# Patient Record
Sex: Female | Born: 1970 | Race: White | Hispanic: No
Health system: Western US, Academic
[De-identification: ages and names within clinical notes are randomized; demographics above are authoritative.]

## PROBLEM LIST (undated history)

## (undated) DIAGNOSIS — J309 Allergic rhinitis, unspecified: Secondary | ICD-10-CM

## (undated) DIAGNOSIS — G43909 Migraine, unspecified, not intractable, without status migrainosus: Secondary | ICD-10-CM

## (undated) DIAGNOSIS — E785 Hyperlipidemia, unspecified: Secondary | ICD-10-CM

## (undated) DIAGNOSIS — G4700 Insomnia, unspecified: Secondary | ICD-10-CM

## (undated) DIAGNOSIS — K589 Irritable bowel syndrome without diarrhea: Secondary | ICD-10-CM

## (undated) DIAGNOSIS — F32A Depression, unspecified: Secondary | ICD-10-CM

## (undated) DIAGNOSIS — D352 Benign neoplasm of pituitary gland: Secondary | ICD-10-CM

## (undated) DIAGNOSIS — F419 Anxiety disorder, unspecified: Secondary | ICD-10-CM

## (undated) DIAGNOSIS — F319 Bipolar disorder, unspecified: Secondary | ICD-10-CM

## (undated) DIAGNOSIS — R519 Headache, unspecified: Secondary | ICD-10-CM

## (undated) HISTORY — PX: PR TONSILLECTOMY & ADENOIDECTOMY <AGE 12: 42820

## (undated) HISTORY — PX: ENDOSCOPY: SHX5148

## (undated) HISTORY — PX: PR ANES; KNEE AREA SURGERY: AN01360

## (undated) HISTORY — DX: Depression, unspecified: F32.A

## (undated) HISTORY — PX: PR TOTAL ABDOMINAL HYSTERECT W/WO RMVL TUBE OVARY: 58150

## (undated) HISTORY — DX: Migraine, unspecified, not intractable, without status migrainosus: G43.909

## (undated) HISTORY — DX: Allergic rhinitis, unspecified: J30.9

## (undated) HISTORY — PX: PR ANES; SHOULDER ARTHROSCOPY/SURGERY: AN29806

## (undated) HISTORY — PX: PR CHOLECYSTECTOMY: 47600

## (undated) HISTORY — DX: Benign neoplasm of pituitary gland: D35.2

## (undated) HISTORY — DX: Insomnia, unspecified: G47.00

## (undated) HISTORY — DX: Hyperlipidemia, unspecified: E78.5

## (undated) HISTORY — DX: Bipolar disorder, unspecified: F31.9

## (undated) HISTORY — DX: Anxiety disorder, unspecified: F41.9

## (undated) HISTORY — PX: PR UNLISTED PROCEDURE FOOT/TOES: 28899

## (undated) HISTORY — DX: Irritable bowel syndrome without diarrhea: K58.9

## (undated) HISTORY — DX: Headache, unspecified: R51.9

---

## 2014-11-12 ENCOUNTER — Encounter (HOSPITAL_BASED_OUTPATIENT_CLINIC_OR_DEPARTMENT_OTHER): Payer: Self-pay | Admitting: Neurological Surgery

## 2014-12-08 ENCOUNTER — Other Ambulatory Visit (HOSPITAL_BASED_OUTPATIENT_CLINIC_OR_DEPARTMENT_OTHER): Payer: Self-pay | Admitting: Family

## 2014-12-08 ENCOUNTER — Other Ambulatory Visit (HOSPITAL_BASED_OUTPATIENT_CLINIC_OR_DEPARTMENT_OTHER): Payer: Self-pay | Admitting: Neurological Surgery

## 2014-12-08 DIAGNOSIS — D352 Benign neoplasm of pituitary gland: Secondary | ICD-10-CM

## 2014-12-08 DIAGNOSIS — E220 Acromegaly and pituitary gigantism: Secondary | ICD-10-CM

## 2014-12-09 ENCOUNTER — Other Ambulatory Visit: Payer: Self-pay | Admitting: Family

## 2015-01-02 ENCOUNTER — Telehealth (HOSPITAL_BASED_OUTPATIENT_CLINIC_OR_DEPARTMENT_OTHER): Payer: Self-pay | Admitting: Neurological Surgery

## 2015-01-02 NOTE — Telephone Encounter (Signed)
(  TEXTING IS AN OPTION FOR [facility] ONLY)  Is this a [facility] clinic? No      RETURN CALL: Detailed message on voicemail only      SUBJECT:  General Message     REASON FOR REQUEST: Questions about upcoming appointment/illness    MESSAGE: Patient stated that she is starting to feel what might be a cold coming on-- sore throat, some drainage. She has multiple appointments next week and is wanting to speak to someone about how this may conflict with those appointments.

## 2015-01-02 NOTE — Telephone Encounter (Signed)
Patient scheduled for surgery on 01/09/15. Preops scheduled for 01/06/15-01/07/15. Patient will be traveling from [place].    Patient called in stating that she was having a sore throat and some nasal drainage. She followed up with a local MD. Her rapid strep test was negative and the official report should be available on 01/03/15. She states the nasal drainage is clear only. She denies any other symptoms. No fever present. She is using a nettipot, keeping hydrated, and resting.     Patient wanted to confirm that based on these symptoms that she was still safe to travel here next week for surgery. She will wear a mask when outside to prevent the risk of any new infections, especially on the plane ride. I told her that based on these symptoms she should still come to her appointments next week. She should follow up with her local doctor in getting the results from the strep culture. If they require her to be on antibiotics after the test comes back she is encouraged to take them.     I provided her with the nurse care line number to contact them over the long weekend. She understands she should call them if she develops any new or worsening symptoms. She is aware treatment must come from her local provider, however, they can page the resident on call to get their opinion if she should still come to her appointments with [place] next week.     Call back numbers provided. No further questions or concerns.

## 2015-01-04 ENCOUNTER — Ambulatory Visit: Payer: Self-pay

## 2015-01-04 NOTE — Telephone Encounter (Signed)
-----   Message from [place] sent at 01/04/2015  8:48 AM PST -----  Regarding: [facility] Pt - pre surgery question - pt have a cold    >> IRISH JOY E LAYADOR 01/04/2015 08:48 AM  [facility] Pt - pre surgery question - pt have a cold

## 2015-01-04 NOTE — Telephone Encounter (Signed)
12/27 tues itchy throat, r/o strep, neg  12/30-31 the symptoms worst, may have had a low grade fever 12/30  Using neti pot regularly  Chief complaint nasal congestion and sneezing  Pt denies cough or SOB  Surgery scheduled 1/06, leaving [facility] 1/03 am.  [facility] RN instructed:  Continue with extra fluids, good nutrition and rest  Keep on schedule, doctors will make determination on pts health status during pre-op assessment.    Call CCL back anytime if symptoms worsen or further questions.

## 2015-01-06 ENCOUNTER — Encounter (HOSPITAL_BASED_OUTPATIENT_CLINIC_OR_DEPARTMENT_OTHER): Payer: Medicare Other | Admitting: "Endocrinology

## 2015-01-06 ENCOUNTER — Ambulatory Visit (HOSPITAL_BASED_OUTPATIENT_CLINIC_OR_DEPARTMENT_OTHER): Payer: Medicare Other | Admitting: Neurological Surgery

## 2015-01-06 ENCOUNTER — Encounter (HOSPITAL_BASED_OUTPATIENT_CLINIC_OR_DEPARTMENT_OTHER): Payer: Self-pay | Admitting: "Endocrinology

## 2015-01-06 ENCOUNTER — Ambulatory Visit (HOSPITAL_BASED_OUTPATIENT_CLINIC_OR_DEPARTMENT_OTHER): Payer: Medicare Other

## 2015-01-06 ENCOUNTER — Other Ambulatory Visit (HOSPITAL_BASED_OUTPATIENT_CLINIC_OR_DEPARTMENT_OTHER): Payer: Self-pay

## 2015-01-06 ENCOUNTER — Other Ambulatory Visit (HOSPITAL_BASED_OUTPATIENT_CLINIC_OR_DEPARTMENT_OTHER): Payer: Medicare Other

## 2015-01-06 ENCOUNTER — Encounter (HOSPITAL_BASED_OUTPATIENT_CLINIC_OR_DEPARTMENT_OTHER): Payer: Self-pay | Admitting: Neurological Surgery

## 2015-01-06 ENCOUNTER — Other Ambulatory Visit: Payer: Self-pay | Admitting: Family

## 2015-01-06 ENCOUNTER — Ambulatory Visit (HOSPITAL_BASED_OUTPATIENT_CLINIC_OR_DEPARTMENT_OTHER): Payer: Medicare Other | Admitting: "Endocrinology

## 2015-01-06 VITALS — BP 112/61 | HR 96 | Temp 99.3°F | Ht 67.5 in | Wt 158.0 lb

## 2015-01-06 DIAGNOSIS — E220 Acromegaly and pituitary gigantism: Secondary | ICD-10-CM

## 2015-01-06 DIAGNOSIS — D352 Benign neoplasm of pituitary gland: Secondary | ICD-10-CM

## 2015-01-06 DIAGNOSIS — E785 Hyperlipidemia, unspecified: Secondary | ICD-10-CM | POA: Insufficient documentation

## 2015-01-06 LAB — CREATININE BY I_STAT (POC), ~~LOC~~: Creatinine (POC): 0.8 mg/dL (ref 0.38–1.02)

## 2015-01-06 NOTE — Progress Notes (Signed)
Carrie Mathis is a 45 year old female sent in consultation from Dr. 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  The patient describes obtaining an MRI of the head in April 2012 prompted by headaches.  This showed a incidental right sided sellar lesion.  At the time she remembers having pituitary evaluation which showed no evidence of hypersecretion.  She had a repeat MRI 15 months later that showed stability of lesion.  Because of symptoms of hair loss and worsening anxiety she had an endocrine evaluation which included a repeat MRI that revealed growth of this lesion and an evaluation that revealed increased IGF-I on 2 occasions and non-growth hormone suppression following a an oral glucose tolerance test.  Her other pituitary function was normal and prolactin is 11.6.  She has noted increased ring size and increase foot size.  She has had increased diaphoresis She has not noted a change in facial features.  She has lost weight voluntarily of about 25-30 pounds and this is resulted in a thinning of the face.  She denies any spacing of her dentition she denies snoring she denies osteoporotic fractures.  She's had no symptoms of carpal tunnel no symptoms of diabetes and no new onset of hypertension.  She has had profound lethargy and achiness.  An evaluation for hypercortisolism has been unrevealing with normal late night salivary cortisols 3.    Patient Active Problem List   Diagnosis   . Hyperlipidemia   . 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 Pituitary adenoma [facility])     Past Surgical History   Procedure Laterality Date   . Cesarean delivery only       1988, 1989   . Unlisted procedure foot/toes       spur excision   . Anes; shoulder arthroscopy/surgery     . Unlisted procedure foot/toes Left      Baxters never release   . Tonsillectomy & adenoidectomy <age 37     . Anes; knee area surgery     . Total abdominal hysterect w/wo rmvl tube ovary     . Cholecystectomy     . Endoscopy       Outpatient  Prescriptions Prior to Visit   Medication Sig Dispense Refill   . Biotin 1 MG Oral Cap Take by mouth daily.     . Estradiol (VIVELLE-DOT) 0.1 MG/24HR Transdermal PATCH BIWEEKLY Place 1 patch on the skin 2 times a week.     . Hydroxychloroquine Sulfate 200 MG Oral Tab Take 200 mg by mouth daily.     . Lidocaine 5 % External Patch Apply 1 patch onto the skin every 24 hours. Remove patch(s) after 12 hours.     Marland Kitchen LORazepam 0.5 MG Oral Tab Take 0.5 mg by mouth.     . Multiple Vitamins-Minerals (MULTIVITAMIN ADULT OR) Take by mouth daily.     . Niacin 500 MG Oral Tab Take 500 mg by mouth.     . Turmeric, Curcuma Longa, Powder Use 750 mg daily.     . Zolpidem Tartrate 10 MG Oral Tab Take 10 mg by mouth at bedtime as needed for sleep.       No facility-administered medications prior to visit.       SHx:No tobacco, no ETOH, lives in [place]   FHx mother with depression diabetes and hypertension and father is healthy brother with depression    ROS:  Review of Systems   Constitutional: Positive for malaise/fatigue.   Eyes: Negative.  Respiratory: Negative.    Cardiovascular: Negative.    Gastrointestinal: Negative.    Genitourinary: Positive for frequency.   Neurological: Positive for headaches.   Psychiatric/Behavioral: The patient is nervous/anxious and has insomnia.         Trouble concentrating  Decreased focus       Physical Exam  BP 112/61 mmHg  Pulse 96  Temp(Src) 99.3 F (37.4 C) (Temporal)  Ht 5' 7.5" (1.715 m)  Wt 158 lb (71.668 kg)  BMI 24.37 kg/m2  SpO2 95%  Abdomen  non-tender, non-distended, nl BS, no HSM, CV  Auscultation  regular rhythm, no murmurs, no rubs, no gallops, Eyes  EOMI,PERRLA, Fundi normal, General  in no apparent distress and well developed and well nourished, No overt acromegalic features HENT  NCAT, Oral mucosa moist, no lesions, Lungs  nl effort, clear to percussion and auscultation, Neck  Supple, no adenopathy, no abnormal masses, no asymmetry, no jugular venous distention, ROM normal,  carotid upstrokes normal, Neurologic  Motor strength normal, intact vibratory sense in lower exts, Musculoskeletal  Spine ROM normal. Muscular strength intact., Skin  no acne, no hirsutism, no acanthosis nigricans, no striae and Thyroid  normal to inspection and palpation    Laboratory and Radiologic Imaging:  10/19/2014 IGF-I of 2 63 ng/mL on 10/09/14 2 64 ng/mL (44-227)  Prolactin of 11.6  Growth hormone at baseline 1.9 following oral glucose tolerance test one hour 1.05, 2 hour  1.07  MRI- 10/23/14 with the right lateral sellar adenoma abutting the right cavernous sinus.  Increased growth compared to April 2012.  Impression: This is a 45 year old female with an evaluation consistent with a diagnosis  of acromegaly without overt clinical evidence of excess GH except hand and foot size increase.  Biochemical evidence of GH excess without other anterior pituitary dysfunction    Plan:   Scheduled for TSS with Dr Jonna Munro.  Excellent pre-op evaluation, will repeat IGF-1 to assess baseline at our lab for follow-up. Also will need post-op anterior pituitary assessment.

## 2015-01-06 NOTE — Progress Notes (Signed)
See dictation

## 2015-01-06 NOTE — Progress Notes (Signed)
Carrie Mathis, Carrie Mathis          [id number]          01/06/2015      NEUROSURGERY CLINIC NOTE       REFERRING PROVIDER     Dr. Wardell Heath, endocrinologist in [place], [place].    REASON FOR REFERRAL     Radiographic enlargement and symptomatic elevated hormones associated with known pituitary macroadenoma.    HISTORY OF PRESENT ILLNESS     Carrie Mathis is a 45 year old right-handed female who presents for initial consultation.  She was initially diagnosed with a pituitary macroadenoma in the setting of headaches back in 2012 locally in [place], [place].  She had a subsequent MRI 2 years later in 2014 that was radiographically stable, as well as IGF-1 levels measured to be 215 and 228 in 2012 and 2013 respectively (considered normal by an outside endocrinologist).  She apparently had been doing okay from a neurologic perspective, though started experiencing a constellation of symptoms over the past year.  Specifically, she began having progressive hair loss and thinning in April 2016.  This was attributed to a significant amount of stress secondary to the loss of her dog by her primary care physician.  She additionally developed symptoms of feeling sluggish, and this prompted a referral to her endocrinologist to be reinvestigate her hormone profile.  At this point, she ended up seeing a new endocrinologist, Dr. Wardell Heath.  She had an IGF-1 level in October that was measured at 264, with confirmatory repeat testing showing elevated levels.  Her growth hormone levels continued to be normal, though I only have the values from October 2016 to examine.  Reportedly, the rest of her pituitary hormone profile was normal, though given her elevated IGF-1 level which was new, and her new symptoms, an MRI was ordered which demonstrated radiographic growth of her known pituitary macroadenoma.  In hindsight, she does endorse that her ring size has changed, prompting her to no longer fit in her current ring.  Additionally, given her  sluggishness, she made a successful effort towards weight loss and actually lost 25 pounds, though paradoxically her shoe size actually increased.  She did experience cardiac palpitations at one point, and saw an outside cardiologist in [place] who monitored with a Holter monitor and did not detect any concerning pathology.  She was referred to the [facility] pituitary program for discussion of operative intervention.  Mild headaches, though these are her baseline.  No nausea or vomiting.  No seizures.  No numbness or weakness in one spot over the other.  She has no issues with her vision.  No issues with her body temperature control.    PAST MEDICAL HISTORY     1. Anxiety.  2. 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  3. Insomnia.    PAST SURGICAL HISTORY     1. Tonsillectomy.  2. Total abdominal hysterectomy.  3. Laparoscopic cholecystectomy.  4. Right plantar fascial release.  5. Right tarsal tunnel release in 2008.  6. Bilateral frozen shoulder syndrome.  7. Left knee arthroscopy.  8. Right knee meniscus tear and repair.  9. Right shoulder rotator cuff repair x2,  10. Left shoulder labral tear, repair x3.    CURRENT MEDICATIONS     1. Biotin.  2. Clobetasol.  3. Vitamin B12.  4. Estradiol.  5. Hydroxychloroquine.  6. Hydroxyzine.  7. Topical lidocaine for lower extremity pain  8. Lorazepam as needed.  9. Milk thistle.  10.  Pumpkin seed oil.  11. Niacin.  12. Propranolol as needed.  13. Rhodiola rosea.  14. Tumeric.  15. Zolpidem as needed.    ALLERGIES     No known drug allergies.    SOCIAL HISTORY     No smoking or recreational drug use.  Minimal alcohol.  She is married and lives in [place], [place] with her husband.  She used to work in M[employer] at [employer], though has not worked for 3 years, partially attributed to her constellation of medical issues.  Her and her husband have 2 adult children.    FAMILY HISTORY     Positive for an "aggressive brain tumor" that  sounds like a glioblastoma in her maternal aunt.  No other family history of brain tumors, brain bleeds or spine problems.    REVIEW OF SYSTEMS     A full review of systems was conducted and is negative for any symptoms other than those in history of present illness.    PHYSICAL EXAMINATION     VITAL SIGNS:  Blood pressure 112/61, temperature 37.4 Celsius, pulse 96, saturating 95% on room air.  GENERAL:  She is in no acute distress, pleasant, interactive with the exam.  CARDIOVASCULAR:  Regular rate and rhythm.  LUNGS:  Breathing comfortably on room air.  ABDOMEN:  Soft, nontender, nondistended.  NEUROLOGIC:  Awake, interactive, alert and oriented x3.  Pupils equally round and reactive to light.  Extraocular movements intact.  Face symmetric.  Tongue midline.  No pronator drift.  She has 5/5 strength throughout upper and lower extremities bilaterally.    IMAGING     MRI of the brain is reviewed with Dr. Jonna Munro and compared to her old prior imaging, she has a sellar mass, slightly asymmetric towards the right side, with invasion into the right cavernous sinus.  The mass is T2 isointense and T1 hypointense.  The mass measures approximately 1.3 x 1 cm in craniocaudal and medial lateral dimensions, respectively.  On reviewing her CT scan, it does not appear that she has any aeration of her anterior clinoid processes bilaterally.  The mass is not abutting the optic chiasm, though it is abutting the right carotid artery through the cavernous sinus.    ASSESSMENT AND PLAN     This is a right-handed female who presents with radiographic enlargement and new symptoms related to a pituitary macroadenoma, presumed to be due to elevated levels of growth hormone, IGF-1  In reviewing her history, we do believe that she is demonstrating a number of symptoms that are certainly new over the past year, though more concerningly, we wonder as to whether or not some of her connective tissue issues and multiple joint surgeries/tears  have been related to a subclinical elevation in growth hormone and IGF-1.  We discussed with her the operative procedure of a transsphenoidal approach, with the assistance of Dr. Kary Kos in the Department of Otolaryngology.  We also discussed the risk of cerebrospinal fluid leak, and the hopeful avoidance of an abdominal fat graft, nasal septal flap, and lumbar drain.  We explained all the risks and potential benefits, and specifically quoted her a 95% chance of cure with operative intervention.  All of her questions were answered, and she is eager to move forward with surgery, which is currently scheduled for Friday.

## 2015-01-07 ENCOUNTER — Encounter (HOSPITAL_BASED_OUTPATIENT_CLINIC_OR_DEPARTMENT_OTHER): Payer: Self-pay | Admitting: Family

## 2015-01-07 ENCOUNTER — Ambulatory Visit (HOSPITAL_BASED_OUTPATIENT_CLINIC_OR_DEPARTMENT_OTHER): Payer: Medicare Other | Admitting: Facial Plastic Surgery

## 2015-01-07 ENCOUNTER — Ambulatory Visit (HOSPITAL_BASED_OUTPATIENT_CLINIC_OR_DEPARTMENT_OTHER): Payer: Medicare Other | Admitting: Family

## 2015-01-07 ENCOUNTER — Other Ambulatory Visit (HOSPITAL_BASED_OUTPATIENT_CLINIC_OR_DEPARTMENT_OTHER): Payer: Self-pay | Admitting: Family

## 2015-01-07 ENCOUNTER — Ambulatory Visit (HOSPITAL_BASED_OUTPATIENT_CLINIC_OR_DEPARTMENT_OTHER): Payer: Medicare Other

## 2015-01-07 VITALS — BP 116/79 | HR 92 | Temp 98.6°F | Ht 67.5 in | Wt 160.0 lb

## 2015-01-07 DIAGNOSIS — E220 Acromegaly and pituitary gigantism: Secondary | ICD-10-CM

## 2015-01-07 DIAGNOSIS — D352 Benign neoplasm of pituitary gland: Secondary | ICD-10-CM

## 2015-01-07 DIAGNOSIS — Z01818 Encounter for other preprocedural examination: Secondary | ICD-10-CM

## 2015-01-07 LAB — URINALYSIS WORKUP
Bilirubin (Qual), URN: NEGATIVE
Glucose Qual, URN: NEGATIVE mg/dL
Ketones, URN: NEGATIVE mg/dL
Leukocyte Esterase, URN: NEGATIVE
Nitrite, URN: NEGATIVE
Occult Blood, URN: NEGATIVE
Protein (Alb Semiquant), URN: NEGATIVE mg/dL
Specific Gravity, URN: 1.005 g/mL (ref 1.002–1.027)
pH, URN: 6 (ref 5.0–8.0)

## 2015-01-07 LAB — CBC (HEMOGRAM)
Hematocrit: 43 % (ref 36–45)
Hemoglobin: 15 g/dL (ref 11.5–15.5)
MCH: 31.3 pg (ref 27.3–33.6)
MCHC: 35.2 g/dL (ref 32.2–36.5)
MCV: 89 fL (ref 81–98)
Platelet Count: 217 10*3/uL (ref 150–400)
RBC: 4.8 10*6/uL (ref 3.80–5.00)
RDW-CV: 12.3 % (ref 11.6–14.4)
WBC: 8.23 10*3/uL (ref 4.3–10.0)

## 2015-01-07 LAB — BASIC METABOLIC PANEL
Anion Gap: 8 (ref 4–12)
Calcium: 9.1 mg/dL (ref 8.9–10.2)
Carbon Dioxide, Total: 25 meq/L (ref 22–32)
Chloride: 103 meq/L (ref 98–108)
Creatinine: 0.65 mg/dL (ref 0.38–1.02)
GFR, Calc, African American: >60 mL/min/{1.73_m2}
GFR, Calc, European American: >60 mL/min/{1.73_m2}
Glucose: 103 mg/dL (ref 62–125)
Potassium: 3.8 meq/L (ref 3.6–5.2)
Sodium: 136 meq/L (ref 135–145)
Urea Nitrogen: 12 mg/dL (ref 8–21)

## 2015-01-07 LAB — ESTRADIOL: Estradiol: 134 pg/mL

## 2015-01-07 LAB — FOLLICLE STIMULATING HORMONE: Follicle Stimulating Hormone: 7 m[IU]/mL

## 2015-01-07 LAB — PROTHROMBIN TIME
Prothrombin INR: 1.1 (ref 0.8–1.3)
Prothrombin Time Patient: 13.6 s (ref 10.7–15.6)

## 2015-01-07 LAB — BLOOD TYPE CONFIRMATION: ABO/Rh: O POS

## 2015-01-07 LAB — PROLACTIN: Prolactin: 12 ng/mL

## 2015-01-07 NOTE — Patient Instructions (Signed)
Call the [facility] for any of the following:  . Severe or unusual headache  . Nausea and vomiting  . Temperature over 101  . Problems with balance  . Difficulty walking, poor coordination, stroke-like symptoms  . Impaired vision  . Loss of bowel and bladder control  . Personality changes, confusion, memory problems, seizures    Endoscopic Transnasal Transphenoidal Surgeries Only  . Apply Normal Saline nasal spray to each nostril every 1-2 hours as needed for congestion.  . Wipe nose gently for 2 weeks  . Do not blow your nose for 2 weeks unless Dr. Jonna Munro approves it.   Marland Kitchen Open mouth when coughing or sneezing  . Do not lift objects weighing over 5 pounds for 2 weeks unless otherwise instructed  . Do not strain when having a bowel movement.  Madaline Brilliant to shower 24 hours after surgery.Use your regular shampoo or baby shampoo.  . No soaking or bathe   Keep your head always above your heart level.

## 2015-01-07 NOTE — Progress Notes (Signed)
CC  H&P for upcoming surgery    SUBJECTIVE    History of Present Illness    Carrie Mathis is a very pleasant 45 year old female who has a past medical history of pituitary adenoma found in 04/2010 due to headaches. At the time her pituitary panel was within normal litis. However, serial imaging has showed increasing size of tumor along with elevated IGF1 levels and non GH suppression with OGTT. She has noticed increased ring size and shoe size along with increased night sweats. She denies any changes in her facial features, no changes in her dentition. Denies any heart disease, no dyslipidemia, or DM. She has intentionally lost over 25 lbs.     Amyla Delores Kisel denies any complaints of seizures, speech problems or weakness    Past Medical History  Past Medical History   Diagnosis Date   . Insomnia    . Anxiety    . Bipolar disorder ([place])    . Migraine    . Hyperlipidemia    . Depression    . IBS (irritable bowel syndrome)    . Pituitary adenoma ([facility])    . Allergic rhinitis due to allergen    . Headache        Past Surgical History  Past Surgical History   Procedure Laterality Date   . Cesarean delivery only       1988, 1989   . Unlisted procedure foot/toes       spur excision   . Anes; shoulder arthroscopy/surgery     . Unlisted procedure foot/toes Left      Baxters never release   . Tonsillectomy & adenoidectomy <age 52     . Anes; knee area surgery     . Total abdominal hysterect w/wo rmvl tube ovary     . Cholecystectomy     . Endoscopy         Medications  Current Outpatient Prescriptions   Medication Sig Dispense Refill   . Biotin 1 MG Oral Cap Take by mouth daily.     . Clobetasol Propionate 0.05 % External Solution 2 times a day.     . Cyanocobalamin (B-12) 2500 MCG Oral Tab      . Estradiol (VIVELLE-DOT) 0.1 MG/24HR Transdermal PATCH BIWEEKLY Place 1 patch on the skin 2 times a week.     . Hydroxychloroquine Sulfate 200 MG Oral Tab Take 200 mg by mouth daily.     . HydrOXYzine HCl 25 MG Oral Tab Take  25 mg by mouth at bedtime.     . Lidocaine 5 % External Patch Apply 1 patch onto the skin every 24 hours. Remove patch(s) after 12 hours.     Marland Kitchen LORazepam 0.5 MG Oral Tab Take 0.5 mg by mouth.     . Milk Thistle 200 MG Oral Cap      . Misc Natural Products (PUMPKIN SEED OIL OR)      . Multiple Vitamins-Minerals (MULTIVITAMIN ADULT OR) Take by mouth daily.     . Niacin 500 MG Oral Tab Take 500 mg by mouth.     . Propranolol HCl 20 MG Oral Tab Take 20 mg by mouth 2 times a day.     . Rhodiola rosea (RHODIOLA OR) Take 120 mg by mouth.     . Turmeric, Curcuma Longa, Powder Use 750 mg daily.     Marland Kitchen Unclassified (OTHER MEDS, SEE COMMENTS,) Essential oils:   Lavender, peppermint, lemon, lemon balm, citrus, cinnamon, thyme, frankincense     .  Zolpidem Tartrate 10 MG Oral Tab Take 10 mg by mouth at bedtime as needed for sleep.       No current facility-administered medications for this visit.       Allergies  Review of patient's allergies indicates:  No Known Allergies    Family History  Family History   Problem Relation Age of Onset   . Hypertension Mother    . Alcohol Abuse Father    . Depression Brother    . Drug Abuse Brother    . Crohn's Disease Brother    . Cancer Brother      Kidney (removed), Lung, Liver   . Cancer Maternal Aunt      Brain   . Heart Disease Maternal Aunt    . Mental Retardation Maternal Aunt    . Melanoma Maternal Aunt    . Cancer Paternal Aunt      Gastric    . Cancer Maternal Grandmother      Cervical   . Heart Disease Maternal Grandfather    . Cancer Paternal Grandmother      Breast       Social History  Social History     Social History   . Marital Status: Married     Spouse Name: N/A   . Number of Children: N/A   . Years of Education: N/A     Occupational History   . Not on file.     Social History Main Topics   . Smoking status: Never Smoker    . Smokeless tobacco: Never Used   . Alcohol Use: 0.6 oz/week     1 Glasses of wine per week   . Drug Use: No   . Sexual Activity: Not on file     Other  Topics Concern   . Not on file     Social History Narrative    She lives in [place], she is disabled, she used to be a Community education officer at [employer],. Married with two children. No smoking.        Review of Systems  Constitutional: Negative for fever, chills or night sweats. Intentional weight loss   Cardiovascular: Denies chest pain, heart palpitations, or edema.  Respiratory: Denies cough, wheezing, SOB, dyspnea.   GI/Hepatic: Negative for GERD, Positive for IBS alternating constipation and diarrhea.   Renal/urologic: Denies dysuria, hematuria.   Neurologic: See HPI.   Hematologic: Negative for coagulation disorders.  Endocrinology: Negative for thyroid disorders or diabetes      OBJECTIVE    Physical Exam  BP 116/79 mmHg  Pulse 92  Temp(Src) 98.6 F (37 C) (Temporal)  Ht 5' 7.5" (1.715 m)  Wt 160 lb (72.576 kg)  BMI 24.68 kg/m2  SpO2 100%  General Appearance: Pleasant, NAD, WH.   HEENT: Normocephalic. Carotid pulses are 2+ bilaterally without bruits. Neck has full active ROM.  Jaw mobility is intact.   Cardiac: S1, S2 with regular rate and rhythm. No coronary murmurs.   Respiratory: Regular even respiration, lungs are clear to auscultation bilaterally with good air exchange.    Abdominal: Abdomen is soft and non-tender to palpation. Active bowel sounds.  Extremities: Warm and non-cyanotic with 2+ peripheral pulses BUE/BLE.  No edema.   Neurologic: A&O to person, place, time and event. Pupils equal, round, and reactive to light. Extraocular movements intact. Face is symmetric, sensation is intact bilaterally.  Hearing intact to finger bilaterally. Speech articulated. Palate elevation symmetric. Uvula midline. Tongue protrusion midline. Shoulders shrug 5/5 bilaterally.  Strength 5/5 in  upper and lower extremities. Sensation was intact to  light touch in upper and lower extremities. No pronator drift.   Steady gait.   Psychiatric: Mood and affect are appropriate throughout interview. Pt cooperates well during  exam.      ASSESSMENT  45 year old female scheduled for a Extended ETSS for removal of tumor, possible fat graft, lumbar drain, septal mucosal flap  by Dr. Jonna Munro and Dr. Myna Bright on 01/09/2015      DISCUSSION AND PLAN   We had a discussion about the proposed surgical plan. We discussed the risks and benefits of the operation, the use of general anesthesia, and the follow up plan. Risks discussed include but are not limited to infection, bleeding, medication reaction, neurological deficits, CSF leak, incomplete resolution of pre-op symptoms, need for further surgery, poor wound healing, stroke, coma, even death.  The patient verbalized understanding and has elected to proceed with this elective procedure which will be scheduled to take place in the near future.  Reviewed medications and allergies.   Discussed goals and risks as noted above.   Surgical consent signed by patient and placed in chart.   Obtain laboratory studies: Basic Metabolic, CBC, Coags, UA, Type and Screen.  Prescribed Unasyn as preoperative antibiotics.   Pre-anesthesia visit scheduled for today   Patient verbalized understanding to discontinue all aspirin, NSAIDs, or any other salicylate containing products and supplements beginning 7 days prior to surgery, and not resume them until cleared by neurosurgery after the procedure.   Answered all patient's questions to the best of my abilities.       TIME STATEMENT   I spent greater than 50% of this 60 minute clinic encounter in face-to-face counseling regarding education, imaging review, discussing surgical plan, risks and benefits and coordination of care.   Thank you very much for allowing me to participate in Mr. Poppert care , please contact my office with any questions or concerns.     Salome Holmes, ARNP

## 2015-01-07 NOTE — Progress Notes (Signed)
OTOLARYNGOLOGY - Facial Plastic Surgery CLINIC NEW PATIENT CONSULTATION       Chief Complaint   Patient presents with   . 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  Initially dx'd in 2012 presenting with HA. Since then IGF-1's have been stable in low 200's. MRIs also stable until recently. During last year she has developed hair loss, low energy, and found to have IGF-1 of 264 last October. MRI has demonstrated growth of the math. She has had increase in finger size and shoe size despite overall intentional weight loss.   She denies disturbance of olfaction, taste, vision, or breathing. No h/o recurrent airway / sinus infections.      Current outpatient prescriptions:   .  Biotin 1 MG Oral Cap, Take by mouth daily., Disp: , Rfl:   .  Clobetasol Propionate 0.05 % External Solution, 2 times a day., Disp: , Rfl:   .  Cyanocobalamin (B-12) 2500 MCG Oral Tab, , Disp: , Rfl:   .  Estradiol (VIVELLE-DOT) 0.1 MG/24HR Transdermal PATCH BIWEEKLY, Place 1 patch on the skin 2 times a week., Disp: , Rfl:   .  Hydroxychloroquine Sulfate 200 MG Oral Tab, Take 200 mg by mouth daily., Disp: , Rfl:   .  HydrOXYzine HCl 25 MG Oral Tab, Take 25 mg by mouth at bedtime., Disp: , Rfl:   .  Lidocaine 5 % External Patch, Apply 1 patch onto the skin every 24 hours. Remove patch(s) after 12 hours., Disp: , Rfl:   .  LORazepam 0.5 MG Oral Tab, Take 0.5 mg by mouth., Disp: , Rfl:   .  Milk Thistle 200 MG Oral Cap, , Disp: , Rfl:   .  Misc Natural Products (PUMPKIN SEED OIL OR), , Disp: , Rfl:   .  Multiple Vitamins-Minerals (MULTIVITAMIN ADULT OR), Take by mouth daily., Disp: , Rfl:   .  Niacin 500 MG Oral Tab, Take 500 mg by mouth., Disp: , Rfl:   .  Propranolol HCl 20 MG Oral Tab, Take 20 mg by mouth 2 times a day., Disp: , Rfl:   .  Rhodiola rosea (RHODIOLA OR), Take 120 mg by mouth., Disp: , Rfl:   .  Turmeric, Curcuma Longa, Powder, Use 750 mg daily., Disp: ,  Rfl:   .  Unclassified (OTHER MEDS, SEE COMMENTS,), Essential oils:  Lavender, peppermint, lemon, lemon balm, citrus, cinnamon, thyme, frankincense, Disp: , Rfl:   .  Zolpidem Tartrate 10 MG Oral Tab, Take 10 mg by mouth at bedtime as needed for sleep., Disp: , Rfl:   Please see medication list provided by the patient and confirmed.    ALLERGIES     Review of patient's allergies indicates:  No Known Allergies    Past Medical History   Diagnosis Date   . Insomnia    . Anxiety    . Bipolar disorder ([place])    . Migraine    . Hyperlipidemia    . Depression    . IBS (irritable bowel syndrome)    . Pituitary adenoma ([place])    . Allergic rhinitis due to allergen    . Headache      Past Surgical History   Procedure Laterality Date   . Cesarean delivery only       1988, 1989   . Unlisted procedure foot/toes       spur excision   .  Anes; shoulder arthroscopy/surgery     . Unlisted procedure foot/toes Left      Baxters never release   . Tonsillectomy & adenoidectomy <age 43     . Anes; knee area surgery     . Total abdominal hysterect w/wo rmvl tube ovary     . Cholecystectomy     . Endoscopy       Please see the Patient Health History - Otolaryngology Form page 1-4 completed today and confirmed with the patient.    It has been scanned into Epic Care and is available for review under Media tab in Chart Review.    FAMILY HISTORY    Family History   Problem Relation Age of Onset   . Hypertension Mother    . Alcohol Abuse Father    . Depression Brother    . Drug Abuse Brother    . Crohn's Disease Brother    . Cancer Brother      Kidney (removed), Lung, Liver   . Cancer Maternal Aunt      Brain   . Heart Disease Maternal Aunt    . Mental Retardation Maternal Aunt    . Melanoma Maternal Aunt    . Cancer Paternal Aunt      Gastric    . Cancer Maternal Grandmother      Cervical   . Heart Disease Maternal Grandfather    . Cancer Paternal Grandmother      Breast      Please see the Patient Health History - Otolaryngology Form page 1-4  completed today and confirmed with the patient.  It has been scanned into Epic Care and is available for review under Media tab in Chart Review.    SOCIAL HISTORY     Social History     Social History   . Marital Status: Married     Spouse Name: N/A   . Number of Children: N/A   . Years of Education: N/A     Occupational History   . Not on file.     Social History Main Topics   . Smoking status: Never Smoker    . Smokeless tobacco: Never Used   . Alcohol Use: 0.6 oz/week     1 Glasses of wine per week   . Drug Use: No   . Sexual Activity: Not on file     Other Topics Concern   . Not on file     Social History Narrative    She lives in [place], she is disabled, she used to be a Community education officer at [employer],. Married with two children. No smoking.      Please see the Patient Health History - Otolaryngology Form page 1-4 completed today and confirmed with the patient.    REVIEW OF SYSTEMS     Review of Systems -   Please see the Patient Health History - Otolaryngology Form page 3 completed today and confirmed with the patient.    PHYSICAL EXAMINATION    General: healthy, alert, no distress  Skin: Skin color, texture, turgor normal. No rashes or concerning lesions  Psychiatric:  Normal mood and affect  Neuro:  Alert and oriented x 3. Cranial nerves 2-12 intact  Head: Normocephalic. No masses, lesions, tenderness or abnormalities  Eyes: Lids/periorbital skin normal, Conjunctivae/corneas clear, PERRL, EOM's intact, Globes in normal position  Ears: External ears normal. Canals normal.  Nose: Septum relatively midline, no polyps or purulence  Oropharynx: Lips, mucosa, and tongue normal. Teeth and gums normal., posterior pharynx  without erythema or drainage  Neck: No adenopathy. 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  We discussed the surgery, it's benefits and risks in depth, and she'd like to proceed.   Will arrange partial postop f/u in [place].       Sincerely,  Vania Rea. Crist Infante, M.D.  Professor and Risk analyst, Division of Facial Plastic Surgery  Department of Otolaryngology - [place] [facility]  Pager: [phone]

## 2015-01-08 NOTE — Progress Notes (Signed)
Carrie Mathis is referred to my neurosurgical office and skull base clinic as part of the [place] multi-disciplinary pituitary program clinic at the [facility]. I spent greater than 45 minutes, more than 50% of which was in face to face counseling, review of imaging and discussion of management strategy options. We spent a period of time discussing that I do believe she has early acromegaly and the natural history of the disease. She has a growing tumor that is now well into the right cavernous sinus. I recommend surgery for removal and attempt at cure biochemically. She also met with Dr. Hinton Lovely of neuro-endocrine and tomorrow Dr. Kary Kos of ENT.    We reviewed the natural history of pituitary tumors and the World Health Organization [facility]) grading scheme. We discussed the options of watchful waiting with serial imaging, up front radiation of any kind (photons, protons, radiosurgery such as gammaknife or cyberknife), medical management and surgical resection. The risks and benefits of the extended endoscopic TSS (possibel fat graft, septal mucosal flap and/or lumbar drain) including but not limited to the following: CSF leak, transient or permanent loss of smell, sinus disease, headaches, hypopituitarism, diabetes insipidus (DI), loss of vision, ptosis, diplopia, facial numbness, arterial or venous injury leading to hematoma, stroke, MI, DVT/PE, coma or even death.     She has my card and will let us know how she would like to proceed. If she would like to pursue surgery, this will take place with myself and Dr. Kary Kos.

## 2015-01-09 ENCOUNTER — Inpatient Hospital Stay
Admission: RE | Admit: 2015-01-09 | Discharge: 2015-01-11 | DRG: 615 | Disposition: A | Discharge status: Home / Self Care | Payer: Medicare Other | Attending: Neurological Surgery | Admitting: Neurological Surgery

## 2015-01-09 ENCOUNTER — Inpatient Hospital Stay (HOSPITAL_COMMUNITY): Payer: Medicare Other | Admitting: Neurological Surgery

## 2015-01-09 ENCOUNTER — Other Ambulatory Visit (HOSPITAL_BASED_OUTPATIENT_CLINIC_OR_DEPARTMENT_OTHER): Payer: Self-pay | Admitting: Neurological Surgery

## 2015-01-09 ENCOUNTER — Encounter (HOSPITAL_BASED_OUTPATIENT_CLINIC_OR_DEPARTMENT_OTHER): Payer: Self-pay | Admitting: "Endocrinology

## 2015-01-09 ENCOUNTER — Other Ambulatory Visit: Payer: Self-pay | Admitting: Unknown Physician Specialty

## 2015-01-09 DIAGNOSIS — Z833 Family history of diabetes mellitus: Secondary | ICD-10-CM

## 2015-01-09 DIAGNOSIS — Z8249 Family history of ischemic heart disease and other diseases of the circulatory system: Secondary | ICD-10-CM

## 2015-01-09 DIAGNOSIS — E220 Acromegaly and pituitary gigantism: Secondary | ICD-10-CM | POA: Diagnosis present

## 2015-01-09 DIAGNOSIS — G5600 Carpal tunnel syndrome, unspecified upper limb: Secondary | ICD-10-CM | POA: Diagnosis present

## 2015-01-09 DIAGNOSIS — Z818 Family history of other mental and behavioral disorders: Secondary | ICD-10-CM

## 2015-01-09 DIAGNOSIS — D352 Benign neoplasm of pituitary gland: Principal | ICD-10-CM | POA: Diagnosis present

## 2015-01-09 DIAGNOSIS — G5750 Tarsal tunnel syndrome, unspecified lower limb: Secondary | ICD-10-CM | POA: Diagnosis present

## 2015-01-09 DIAGNOSIS — E785 Hyperlipidemia, unspecified: Secondary | ICD-10-CM | POA: Diagnosis present

## 2015-01-09 LAB — OR BLOOD GAS PANEL, VEN
Base Deficit Blood, VEN: 2.2 meq/L — ABNORMAL HIGH (ref 0.0–2.0)
Base Deficit Blood, VEN: 2.4 meq/L — ABNORMAL HIGH (ref 0.0–2.0)
Bicarbonate, VEN: 23 meq/L (ref 23–27)
Bicarbonate, VEN: 23 meq/L (ref 23–27)
Calcium (Ionized): 1.1 mmol/L — ABNORMAL LOW (ref 1.18–1.38)
Calcium (Ionized): 1.14 mmol/L — ABNORMAL LOW (ref 1.18–1.38)
Carboxyhemoglobin, VEN: 0.9 %
Carboxyhemoglobin, VEN: 1 %
Glucose: 82 mg/dL (ref 62–125)
Glucose: 84 mg/dL (ref 62–125)
Hematocrit: 30 % — ABNORMAL LOW (ref 36–45)
Hematocrit: 35 % — ABNORMAL LOW (ref 36–45)
L Lactate (Direct), Venous Whole Blood: 0.6 mmol/L (ref 0.6–1.9)
L Lactate (Direct), Venous Whole Blood: 0.7 mmol/L (ref 0.6–1.9)
Methemoglobin, VEN: 0.6 % (ref <3.0)
Methemoglobin, VEN: 0.7 % (ref <3.0)
O2 Content, VEN: 13 VOL%
O2 Content, VEN: 16.1 VOL%
O2 Saturation (Frac.), VEN: 94 %
O2 Saturation (Frac.), VEN: 98 %
Potassium: 3.5 meq/L — ABNORMAL LOW (ref 3.7–5.2)
Potassium: 3.6 meq/L — ABNORMAL LOW (ref 3.7–5.2)
Sodium: 140 meq/L (ref 136–145)
Sodium: 140 meq/L (ref 136–145)
Total Hemoglobin, VEN: 11.4 g/dL — ABNORMAL LOW (ref 11.5–15.5)
Total Hemoglobin, VEN: 9.8 g/dL — ABNORMAL LOW (ref 11.5–15.5)
pCO2, VEN: 37 mmHg — ABNORMAL LOW (ref 42–50)
pCO2, VEN: 41 mmHg — ABNORMAL LOW (ref 42–50)
pH, VEN: 7.36 (ref 7.32–7.40)
pH, VEN: 7.39 (ref 7.32–7.40)
pO2, VEN: 168 mmHg — ABNORMAL HIGH (ref 35–40)
pO2, VEN: 76 mmHg — ABNORMAL HIGH (ref 35–40)

## 2015-01-09 LAB — OR BLOOD GAS PANEL, ART
Base Deficit Blood, ART: 1.3 meq/L (ref 0.0–2.0)
Bicarbonate: 23 meq/L (ref 22–26)
Calcium (Ionized): 1.17 mmol/L — ABNORMAL LOW (ref 1.18–1.38)
Carboxyhemoglobin, ART: 0.8 %
Glucose: 87 mg/dL (ref 62–125)
Hematocrit: 39 % (ref 36–45)
L Lactate (Direct), ART WB: 0.5 mmol/L (ref 0.4–1.0)
Methemoglobin, ART: 0.5 % (ref <3.0)
O2 Content: 18.1 VOL% (ref 15–23)
O2 Sat (Frac.), ART: 99 % (ref 94–99)
Potassium: 3.5 meq/L — ABNORMAL LOW (ref 3.7–5.2)
Sodium: 140 meq/L (ref 136–145)
Total Hemoglobin, ART: 12.6 g/dL (ref 11.5–15.5)
pCO2, ART: 36 mmHg (ref 33–48)
pH, ART: 7.41 (ref 7.35–7.45)
pO2: 245 mmHg — ABNORMAL HIGH (ref 70–95)

## 2015-01-09 LAB — SPECIFIC GRAVITY, URN: Specific Gravity, URN: 1.017 g/mL (ref 1.002–1.027)

## 2015-01-09 LAB — URINE C/S: Colony Count: 2500

## 2015-01-09 LAB — GLUCOSE POC, ~~LOC~~
Glucose (POC): 85 mg/dL (ref 62–125)
Glucose (POC): 111 mg/dL (ref 62–125)

## 2015-01-09 LAB — TYPE AND SCREEN, EXTENDED
ABO/Rh: O POS
Antibody Screen: NEGATIVE
Units Ordered: 2

## 2015-01-09 LAB — SODIUM, WBLD: Sodium: 141 meq/L (ref 136–145)

## 2015-01-09 LAB — OSMOLALITY, URINE: Osmolality, URN: 550 mosm/kg (ref 100–850)

## 2015-01-10 LAB — CBC (HEMOGRAM)
Hematocrit: 28 % — ABNORMAL LOW (ref 36–45)
Hematocrit: 25 % — ABNORMAL LOW (ref 36–45)
Hematocrit: 25 % — ABNORMAL LOW (ref 36–45)
Hemoglobin: 9.5 g/dL — ABNORMAL LOW (ref 11.5–15.5)
Hemoglobin: 8.8 g/dL — ABNORMAL LOW (ref 11.5–15.5)
Hemoglobin: 8.7 g/dL — ABNORMAL LOW (ref 11.5–15.5)
MCH: 30.8 pg (ref 27.3–33.6)
MCH: 31.4 pg (ref 27.3–33.6)
MCH: 31.8 pg (ref 27.3–33.6)
MCHC: 34.1 g/dL (ref 32.2–36.5)
MCHC: 35.1 g/dL (ref 32.2–36.5)
MCHC: 35.4 g/dL (ref 32.2–36.5)
MCV: 91 fL (ref 81–98)
MCV: 90 fL (ref 81–98)
MCV: 90 fL (ref 81–98)
Platelet Count: 173 10*3/uL (ref 150–400)
Platelet Count: 147 10*3/uL — ABNORMAL LOW (ref 150–400)
Platelet Count: 153 10*3/uL (ref 150–400)
RBC: 3.08 10*6/uL — ABNORMAL LOW (ref 3.80–5.00)
RBC: 2.8 10*6/uL — ABNORMAL LOW (ref 3.80–5.00)
RBC: 2.74 10*6/uL — ABNORMAL LOW (ref 3.80–5.00)
RDW-CV: 12.4 % (ref 11.6–14.4)
RDW-CV: 12.3 % (ref 11.6–14.4)
RDW-CV: 12.4 % (ref 11.6–14.4)
WBC: 13.35 10*3/uL — ABNORMAL HIGH (ref 4.3–10.0)
WBC: 13.27 10*3/uL — ABNORMAL HIGH (ref 4.3–10.0)
WBC: 11.87 10*3/uL — ABNORMAL HIGH (ref 4.3–10.0)

## 2015-01-10 LAB — OSMOLALITY, URINE
Osmolality, URN: 697 mosm/kg (ref 100–850)
Osmolality, URN: 717 mosm/kg (ref 100–850)
Osmolality, URN: 749 mosm/kg (ref 100–850)
Osmolality, URN: 752 mosm/kg (ref 100–850)

## 2015-01-10 LAB — SPECIFIC GRAVITY, URN
Specific Gravity, URN: 1.024 g/mL (ref 1.002–1.027)
Specific Gravity, URN: 1.023 g/mL (ref 1.002–1.027)
Specific Gravity, URN: 1.024 g/mL (ref 1.002–1.027)
Specific Gravity, URN: 1.022 g/mL (ref 1.002–1.027)

## 2015-01-10 LAB — CORTISOL
Cortisol: 32.8 ug/dL
Cortisol: 25.6 ug/dL

## 2015-01-10 LAB — BASIC METABOLIC PANEL
Anion Gap: 7 (ref 4–12)
Calcium: 8 mg/dL — ABNORMAL LOW (ref 8.9–10.2)
Carbon Dioxide, Total: 22 meq/L (ref 22–32)
Chloride: 108 meq/L (ref 98–108)
Creatinine: 0.57 mg/dL (ref 0.38–1.02)
GFR, Calc, African American: >60 mL/min/{1.73_m2}
GFR, Calc, European American: >60 mL/min/{1.73_m2}
Glucose: 139 mg/dL — ABNORMAL HIGH (ref 62–125)
Potassium: 3.5 meq/L — ABNORMAL LOW (ref 3.6–5.2)
Sodium: 137 meq/L (ref 135–145)
Urea Nitrogen: 8 mg/dL (ref 8–21)

## 2015-01-10 LAB — ELECTROLYTES
Anion Gap: 9 (ref 4–12)
Carbon Dioxide, Total: 22 meq/L (ref 22–32)
Chloride: 110 meq/L — ABNORMAL HIGH (ref 98–108)
Potassium: 3.6 meq/L (ref 3.6–5.2)
Sodium: 141 meq/L (ref 135–145)

## 2015-01-10 LAB — OSMOLALITY
Osmolality: 288 mosm/kg (ref 280–300)
Osmolality: 286 mosm/kg (ref 280–300)
Osmolality: 281 mosm/kg (ref 280–300)

## 2015-01-10 LAB — SODIUM
Sodium: 140 meq/L (ref 135–145)
Sodium: 137 meq/L (ref 135–145)
Sodium: 138 meq/L (ref 135–145)

## 2015-01-10 LAB — PROTHROMBIN TIME
Prothrombin INR: 1.3 (ref 0.8–1.3)
Prothrombin Time Patient: 15.6 s (ref 10.7–15.6)

## 2015-01-11 LAB — BASIC METABOLIC PANEL
Anion Gap: 6 (ref 4–12)
Calcium: 8.2 mg/dL — ABNORMAL LOW (ref 8.9–10.2)
Carbon Dioxide, Total: 25 meq/L (ref 22–32)
Chloride: 113 meq/L — ABNORMAL HIGH (ref 98–108)
Creatinine: 0.56 mg/dL (ref 0.38–1.02)
GFR, Calc, African American: >60 mL/min/{1.73_m2}
GFR, Calc, European American: >60 mL/min/{1.73_m2}
Glucose: 114 mg/dL (ref 62–125)
Potassium: 3.5 meq/L — ABNORMAL LOW (ref 3.6–5.2)
Sodium: 144 meq/L (ref 135–145)
Urea Nitrogen: 3 mg/dL — ABNORMAL LOW (ref 8–21)

## 2015-01-11 LAB — CBC (HEMOGRAM)
Hematocrit: 24 % — ABNORMAL LOW (ref 36–45)
Hemoglobin: 8.3 g/dL — ABNORMAL LOW (ref 11.5–15.5)
MCH: 31.4 pg (ref 27.3–33.6)
MCHC: 35.2 g/dL (ref 32.2–36.5)
MCV: 89 fL (ref 81–98)
Platelet Count: 145 10*3/uL — ABNORMAL LOW (ref 150–400)
RBC: 2.64 10*6/uL — ABNORMAL LOW (ref 3.80–5.00)
RDW-CV: 12.5 % (ref 11.6–14.4)
WBC: 7.25 10*3/uL (ref 4.3–10.0)

## 2015-01-11 LAB — OSMOLALITY
Osmolality: 296 mosm/kg (ref 280–300)
Osmolality: 299 mosm/kg (ref 280–300)
Osmolality: 296 mosm/kg (ref 280–300)
Osmolality: 293 mosm/kg (ref 280–300)

## 2015-01-11 LAB — GLUCOSE POC, ~~LOC~~: Glucose (POC): 163 mg/dL — ABNORMAL HIGH (ref 62–125)

## 2015-01-11 LAB — SODIUM
Sodium: 145 meq/L (ref 135–145)
Sodium: 143 meq/L (ref 135–145)
Sodium: 141 meq/L (ref 135–145)

## 2015-01-11 LAB — SPECIFIC GRAVITY, URN
Specific Gravity, URN: 1.002 g/mL (ref 1.002–1.027)
Specific Gravity, URN: 1.003 g/mL (ref 1.002–1.027)
Specific Gravity, URN: 1.002 g/mL (ref 1.002–1.027)
Specific Gravity, URN: 1.003 g/mL (ref 1.002–1.027)

## 2015-01-11 LAB — PROTHROMBIN & PTT
Partial Thromboplastin Time: 28 s (ref 22–35)
Prothrombin INR: 1.3 (ref 0.8–1.3)
Prothrombin Time Patient: 15.5 s (ref 10.7–15.6)

## 2015-01-12 LAB — INSULIN_LIKE GROWTH FACTOR 1
IGF_1, Z Score: 2.33 SD (ref >=2.0)
Insulin_Like Growth Factor 1: 260 ng/mL — ABNORMAL HIGH (ref 44–227)

## 2015-01-12 NOTE — Progress Notes (Signed)
Documentation only.

## 2015-01-13 ENCOUNTER — Telehealth (HOSPITAL_BASED_OUTPATIENT_CLINIC_OR_DEPARTMENT_OTHER): Payer: Self-pay | Admitting: Neurological Surgery

## 2015-01-13 ENCOUNTER — Ambulatory Visit: Payer: Self-pay

## 2015-01-13 NOTE — Telephone Encounter (Addendum)
1/06-08  Surgeons Myna Bright and Jonna Munro  1.   Acromegaly.    2.   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    3.   Multiple severe comorbidities including arthralgias, carpal and tarsal tunnel.    4.   Frozen section was consistent with benign adenoma. This was quite pale in appearance, most indicative of a growth hormone secreting adenoma.    5.   A gross total resection of the tumor was performed. This was very difficult and complicated since half of the tumor was within the right cavernous sinus, there was a profound amount of venous bleeding throughout so as not to pack the area and lose tissue plane.      Cold feeling, 96.7 F  96.9 F oral  Using the mist, so much back there pt tried to take a deep breath  Sneezing since hospital, sneezing with mouth open  Today if felt so irritated like swollen, feels restricted  Able to take a breath, but feeling constricted  Difficult to eat and breath,  Some nausea, improved with zofran with   Has not needed oxycodone, only taking tylenol at 6:30 pm.    19:19 pgd Dr. Jayme Cloud, [place] resident per [facility] via [facility] paging.  19:25 states is depends on pts comfort level; most care by [facility] NS  19:29 pgd Dr. Army Melia, [facility] NS res per [facility] via [facility] paging.  19:43 2nd page to Dr. Elgie Congo  19:487 Dr. Elgie Congo swelling a bit delayed, but not alarming as long as no clear fluid, no fever, able to breathe  cb to pt    Call CCL back anytime if symptoms worsen or further questions.    CLINIC:  Pt requests a call back Wed 1/11 as she lives in [place], concerned post op

## 2015-01-13 NOTE — Telephone Encounter (Addendum)
Dx: Pituitary macroadenoma s/p surgical resection by Megan Mans MD on 01/09/2015. Pt scheduled to have 2wk wound check up with Marina Gravel and Myna Bright MD on 01/21/2015. Resident of [place] and states she cannot return to [facility] due to weather conditions. Left message for RN stating she has local ENT f/u scheduled next week with Dr. Annamarie Major. Dr. Daine Gravel office requesting patient clinic, OR, and discharge notes for continuity of care. RN faxed requested records to office, pt scheduled for f/u with provider on 01/19/2015. Left VM with call back number. No further questions or concerns at this time. Call back number given.    [facility] ENT  Dr. Annamarie Major  P. [phone]  F. [phone]

## 2015-01-13 NOTE — Telephone Encounter (Signed)
-----   Message from Maureen Ralphs sent at 01/13/2015  6:35 PM PST -----  Regarding: [facility] Pt- Had surgery Friday for Tumor removal, was doing the mister for her nose, and doesn't know if  >> Maureen Ralphs 01/13/2015 06:35 PM  U[facility] Pt- Had surgery Friday for Tumor removal, was doing the mister for her nose, and doesn't know if she irritated the throat while doing that

## 2015-01-14 LAB — HUMAN GROWTH HORMONE: Human Growth Hormone (Sendout): 1.81 ng/mL (ref 0.01–3.61)

## 2015-01-18 LAB — PATHOLOGY, SURGICAL

## 2015-01-19 ENCOUNTER — Telehealth (HOSPITAL_BASED_OUTPATIENT_CLINIC_OR_DEPARTMENT_OTHER): Payer: Self-pay | Admitting: "Endocrinology

## 2015-01-19 ENCOUNTER — Ambulatory Visit: Payer: Self-pay

## 2015-01-19 NOTE — Telephone Encounter (Signed)
(  TEXTING IS AN OPTION FOR [facility] CLINICS ONLY)  Is this a [place] clinic? No      RETURN CALL: Detailed message on voicemail only      SUBJECT:  General Message     REASON FOR REQUEST: Patient advised she is suppose to notify clinic she will be seeing Dr.Dodson at [place].[phone] Appointment is on 01/23/15    MESSAGE: See above

## 2015-01-19 NOTE — Telephone Encounter (Signed)
Protocol: PCP CALL - NO TRIAGE-ADULT-AH  Affirmative: [1] Caller requests to speak ONLY to PCP AND [2] URGENT question  Disposition of Call PCP Now suggested.  Pt calling CCL reports recent Surgery with Dr Posey Pronto Postoperative Pituitary Adenoma. Pt has been challenged with stinging in her nose causing- sneezing and hacking up thick sputum. No fever. No HA. No clear drainage like a drippy faucet. Sneezing with mouth open. Does not blow her nose. Feels like herTaste sensation is coming back, numbness in the front of her palate. Foul taste in her mouth. No foul odor from mouth or nose detected by husband. O/C NS Army Melia paged

## 2015-01-19 NOTE — Telephone Encounter (Signed)
O/C NS Army Melia contacted and updated on pt's issue with sneezing other concerns. No s/s of infection, no fever, no drippy faucet drainage. No HA. Pain controlled with Tylenol and Advil. Foul taste detected today. Took one Oxycodone today. Dr bass reviewed pt's medical record and is comfortable with pt's progression. Pt contacted and updated on above. Advised pt to call back CCL or clinic if condition changes or worsens. OK with plan.

## 2015-01-19 NOTE — Telephone Encounter (Signed)
-----   Message from [facility] sent at 01/19/2015  2:33 PM PST -----  Regarding: [facility] post op Surgery about 5 days ago, keeps sneezing everyday, Has tried not to take too much of Rx  >> [place] 01/19/2015 02:33 PM  [place] post op Surgery about 5 days ago, keeps sneezing everyday, Has tried not to take too much of Rx pain med's, has experienced a slight headache similar to using a nettie pot.

## 2015-01-20 ENCOUNTER — Telehealth (HOSPITAL_BASED_OUTPATIENT_CLINIC_OR_DEPARTMENT_OTHER): Payer: Self-pay | Admitting: Neurological Surgery

## 2015-01-20 ENCOUNTER — Ambulatory Visit: Payer: Self-pay

## 2015-01-20 NOTE — Telephone Encounter (Signed)
Protocol: COLDS-ADULT-AH  Affirmative: Cold with no complications  (all triage questions negative)  Disposition of Home Care suggested.

## 2015-01-20 NOTE — Telephone Encounter (Signed)
Pt. Calling, states she had pituitary tumor removal on 01/09/15 @ [facility] w/NS, Dr. Marcelline Mates. Pt woke up with "a powerful sneeze" in the past hour, sneezing w/mouth closed,noted cherry red to pink blood when tissue held to end of nose. No dizziness, mild headache noted, frontal sinus. Slight nausea from swallowing bloody drainage.    Dr. Reynaldo Minium contacted, NS Resident, and states have pt apply firm pressure for 20 minutes (w/cloth-covered ice pack or cold washcloth also). If bleeding has not stopped after 20 minutes, pt is to call back. CCL RN told pt to firmly pinch her nostrils together for a full 20 minutes, leaning forward to spit out drainage, and to sleep w/head elevated. She WCB if any bleeding has continued.

## 2015-01-20 NOTE — Telephone Encounter (Signed)
-----   Message from [place] sent at 01/20/2015  6:04 AM PST -----  Regarding: "Nose is clogged and has a little bit of drip " - heart rate is  105 - took medication , but it went  >> Carrie Mathis 01/20/2015 06:04 AM  "Nose is clogged and has a little bit of drip " - heart rate is  105 - took medication , but it went up

## 2015-01-20 NOTE — Telephone Encounter (Signed)
-----   Message from [facility] sent at 01/20/2015  5:20 PM PST -----  Regarding: [place] Would like to know if it is Safe to take Nyquil this evening  >> [place] 01/20/2015 05:20 PM  [place] Would like to know if it is Safe to take Nyquil this evening.

## 2015-01-20 NOTE — Telephone Encounter (Signed)
Dx: Pituitary macroadenoma s/p surgical resection by Megan Mans MD on 01/09/2015. Pt called [facility] over the weekend and covering Neurosurgical resident, Elgie Congo MD, to relay symptoms of nose bleed and increased heartrate. RN returned call to patient who stated she went to local ER at [place]. [phone] for evaluation. States heart rate has decreased to 95 bpm and bleeding has stopped. RN instructed patient to have lab results and ED note faxed to [facility], contact information provided and patient stated teach back and understanding. Pt states she is still currently at ED and will send records once complete, RN to f/u. No further questions or concerns at this time. Call back number given.

## 2015-01-20 NOTE — Telephone Encounter (Addendum)
Took 0.5 mg Lorazepam d/t noted HR elev, 105; rested and a few hours later noted to be 106; slight light headedness started today; bleeding from nose has decreased to occasional trickle.  Is most concerned about HR; on call N/S resident, Dr. Army Melia paged at [phone]    At [phone], I recontacted pt and told her to go to ED just to be evaluated; she had taken her BP which was 0000000 systolic; her HR was slightly lower at 98; her nasal bleeding had stopped.  Pt was relieved to go to ED    AT 0815, Dr. Elgie Congo called; I gave him update re pt status, disposition.

## 2015-01-20 NOTE — Telephone Encounter (Signed)
Protocol: POST-OP SYMPTOMS AND QUESTIONS-ADULT-AH(01/17)  Affirmative: Caller has URGENT question and triager unable to answer question  Disposition of Call PCP Now suggested.

## 2015-01-20 NOTE — Telephone Encounter (Signed)
Protocol: POST-OP SYMPTOMS AND QUESTIONS-ADULT-AH(01/10)  Affirmative: Caller has URGENT question and triager unable to answer question  Disposition of Call PCP Now suggested.

## 2015-01-20 NOTE — Telephone Encounter (Signed)
Pt has had onset cold symptoms with body aches and potential flu-like symptoms last night.    Pt wants to know if ok to take nyquil (with acetaminophen, chlorphenirimine plus DM.  Reviewed each ingredient with the pt.  Pt is allergic to none.    Pt post op of neuro/oto surgery 1/6, 10 days ago.  Discussed contacting PCP tomorrow to discuss tamiflu for the developing symptoms. The pt will take the nyquil capsule tonight.

## 2015-01-21 ENCOUNTER — Encounter (HOSPITAL_BASED_OUTPATIENT_CLINIC_OR_DEPARTMENT_OTHER): Payer: Medicare Other | Admitting: Family

## 2015-01-21 ENCOUNTER — Encounter (HOSPITAL_BASED_OUTPATIENT_CLINIC_OR_DEPARTMENT_OTHER): Payer: Medicare Other | Admitting: Facial Plastic Surgery

## 2015-01-22 NOTE — Telephone Encounter (Signed)
RN received the following message from Jonna Munro MD:     Carrie Mathis,     Please let Dr. Myna Bright know to reach out to the ENT seeing this patient.     Vania Rea, she had a cavernous sinus tumor.     Thank you,     MFJ      RN called Dr. Governor Rooks office and stated MD would call Myna Bright MD and took contact information. Email sent to Dr. Myna Bright regarding incoming phone call with provider. No further questions or concerns at this time. Call back number to RN given.

## 2015-01-22 NOTE — Telephone Encounter (Addendum)
RN received ER notes and labs and relayed information to [facility] for review. Pt discharged home from hospital on 01/20/2015, RN returned call to patient to get update about status. States she is feeling better and no longer has low grade fever, previously 99.4, states she does have some yellow drainage from nose and is concerned she has a sinus infection. States she is seeing local ENT, Dr. Beryl Meager tomorrow at [place] for evaluation. Otherwise asymptomatic, denies any clear drainage or uncontrolled headache. No further questions or concerns at this time. RN to request ENT note at completion of appointment. Call back number given.    [facility] ENT  Dr. Osvaldo Human. [phone]

## 2015-01-22 NOTE — Telephone Encounter (Signed)
She had an appt with me and Dr. Myna Bright yesterday that was cancelled, I reviewed ER records which were fine, labs, no more epistaxis or nasal discharge. Please get records from ENT after her visit. Thanks

## 2015-02-04 NOTE — Telephone Encounter (Signed)
Pt completed ENT visit on 01/23/2015. RN received clinic note and scanned into media. Pt findings were within normal limits, no further questions or concerns at this time. Pt continuing with local ENT care, f/u appointment scheduled for 4 weeks.

## 2015-02-05 ENCOUNTER — Telehealth (HOSPITAL_BASED_OUTPATIENT_CLINIC_OR_DEPARTMENT_OTHER): Payer: Self-pay | Admitting: Family

## 2015-02-05 NOTE — Telephone Encounter (Signed)
Patient called wanting to know if her path results are back yet.  I let her know I would pass her question on to Dr. Burnard Hawthorne ARNP and ask her to call [place] back.

## 2015-02-05 NOTE — Telephone Encounter (Signed)
I talked to pt about her path report for pituitary adenoma staining positive for GH. Please fax report to her endocrinologist Dr. Kalman Shan, see media for further information. Thanks

## 2015-02-10 NOTE — Telephone Encounter (Signed)
Dx: Pituitary macroadenoma s/p surgical resection by Megan Mans MD on 01/09/2015. RN faxed Operative note from both surgeons and pathology report to patients local endocrinologist Dr. Wardell Heath. Contact information listed below. No further questions or concerns at this time, call back number given.    Dr. Wardell Heath  [facility]  P. [phone]  F. [phone]

## 2015-02-17 ENCOUNTER — Ambulatory Visit (HOSPITAL_BASED_OUTPATIENT_CLINIC_OR_DEPARTMENT_OTHER): Payer: Medicare Other | Admitting: Family

## 2015-02-17 ENCOUNTER — Ambulatory Visit: Payer: Medicare Other | Attending: Family

## 2015-02-17 ENCOUNTER — Ambulatory Visit (HOSPITAL_BASED_OUTPATIENT_CLINIC_OR_DEPARTMENT_OTHER): Payer: Medicare Other | Admitting: "Endocrinology

## 2015-02-17 VITALS — BP 117/75 | HR 77 | Temp 98.6°F | Ht 67.5 in | Wt 158.0 lb

## 2015-02-17 DIAGNOSIS — D352 Benign neoplasm of pituitary gland: Secondary | ICD-10-CM

## 2015-02-17 DIAGNOSIS — E220 Acromegaly and pituitary gigantism: Secondary | ICD-10-CM | POA: Insufficient documentation

## 2015-02-17 NOTE — Progress Notes (Addendum)
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 Did well post-op with peak cortisol 38 and no evidence of DI or CSF leak. Had URI post-surgery but now with improved sinus congestion and no cough. Scant yellow discharge from nose, nocturia x 2 but no polyuria during ay or excess thirst during day. Noted decreased hand size, 15 pound weight loss.       Patient Active Problem List   Diagnosis   . Hyperlipidemia   . Acromegaly ([place])   . Pituitary adenoma [facility] [place] [facility])     Past Surgical History   Procedure Laterality Date   . Cesarean delivery only       1988, 1989   . Unlisted procedure foot/toes       spur excision   . Anes; shoulder arthroscopy/surgery     . Unlisted procedure foot/toes Left      Baxters never release   . Tonsillectomy & adenoidectomy <age 74     . Anes; knee area surgery     . Total abdominal hysterect w/wo rmvl tube ovary     . Cholecystectomy     . Endoscopy       Outpatient Prescriptions Prior to Visit   Medication Sig Dispense Refill   . Biotin 1 MG Oral Cap Take by mouth daily.     . Clobetasol Propionate 0.05 % External Solution 2 times a day.     . Cyanocobalamin (B-12) 2500 MCG Oral Tab      . Estradiol (VIVELLE-DOT) 0.1 MG/24HR Transdermal PATCH BIWEEKLY Place 1 patch on the skin 2 times a week.     . Hydroxychloroquine Sulfate 200 MG Oral Tab Take 200 mg by mouth daily.     . HydrOXYzine HCl 25 MG Oral Tab Take 25 mg by mouth at bedtime.     . Lidocaine 5 % External Patch Apply 1 patch onto the skin every 24 hours. Remove patch(s) after 12 hours.     Marland Kitchen LORazepam 0.5 MG Oral Tab Take 0.5 mg by mouth.     . Milk Thistle 200 MG Oral Cap      . Misc Natural Products (PUMPKIN SEED OIL OR)      . Multiple Vitamins-Minerals (MULTIVITAMIN ADULT OR) Take by mouth daily.     . Niacin 500 MG Oral Tab Take 500 mg by mouth.     . Propranolol HCl 20 MG Oral Tab Take 20 mg by mouth 2 times a day.     . Rhodiola rosea (RHODIOLA OR) Take 120 mg by mouth.     . Turmeric,  Curcuma Longa, Powder Use 750 mg daily.     Marland Kitchen Unclassified (OTHER MEDS, SEE COMMENTS,) Essential oils:   Lavender, peppermint, lemon, lemon balm, citrus, cinnamon, thyme, frankincense     . Zolpidem Tartrate 10 MG Oral Tab Take 10 mg by mouth at bedtime as needed for sleep.       No facility-administered medications prior to visit.       SHx:No tobacco, no ETOH, lives in [place]   FHx mother with depression diabetes and hypertension and father is healthy brother with depression    ROS:  Review of Systems   Constitutional: Positive for weight loss.   HENT: Positive for congestion.    Cardiovascular: Negative.    Gastrointestinal: Negative.    Genitourinary: Negative.      Physical Exam  BP 117/75 mmHg  Pulse 77  Temp(Src) 98.6 F (37 C) (Temporal)  Ht 5' 7.5" (1.715 m)  Wt 158 lb (71.668 kg)  BMI 24.37 kg/m2  SpO2 100%   CV  Auscultation  regular rhythm, no murmurs, no rubs, no gallops, Eyes  EOMI,PERRLA, General  in no apparent distress and well developed and well nourished, No overt acromegalic features HENT  NCAT, Oral mucosa moist, R nares serosanguinous discharge clear otherwise, Lungs  nl effort, clear to percussion and auscultation, Neck  Supple, no adenopathy, no abnormal masses, no asymmetry, no jugular venous distention, ROM normal, carotid upstrokes normal, Neurologic  Motor strength normal, intact vibratory sense in lower exts, Musculoskeletal  Decreased edema fingers on hands. Spine ROM normal. Muscular strength intact., Skin  no acne, no hirsutism, no acanthosis nigricans, no striae and Thyroid  normal to inspection and palpation    Laboratory and Radiologic Imaging:  1.2017- IGF-1 = 130 (age appropriate)  10/19/2014 IGF-I of 2 63 ng/mL on 10/09/14 2 64 ng/mL (44-227)  Prolactin of 11.6  Growth hormone at baseline 1.9 following oral glucose tolerance test one hour 1.05, 2 hour  1.07  MRI- 10/23/14 with the right lateral sellar adenoma abutting the right cavernous sinus.  Increased growth compared  to April 2012.  Pathology- surgical specimen sparsely granular [place] staining  MRI-2.14.2017- gross resection of R lateral adenoma,       Impression: This is a 45 year old female with an evaluation consistent with a diagnosis  of acromegaly now with evidence of cure, post-op GH < 2.4 and IGF-1 has normalized    Plan:   Will follow with Dr Wardell Heath in [place], strong evidence of cure clinically and biochemically. Would recommend OGTT to prove [place] Suppression but not absolutely necessary given clinical picture.  Other pituitary hormones intact.  Annual IGF-1, return only if IGF-1 increasing.       Addendum- 5.3.2017    Outside labs reviewed, normal IGF-1 and suppressed GH 0.08. Needs annual IGF-1 for surveillance through PCP and sooner if symptoms develop.

## 2015-02-19 NOTE — Progress Notes (Signed)
CHIEF OF COMPLAINT  6 weeks follow up with MRI    SUBJECTIVE    History of Present Illness  Carrie Mathis is a very pleasant 45 year old female who has a past medical history of pituitary adenoma found in 04/2010 due to headaches. At the time her pituitary panel was within normal litis. However, serial imaging showed increasing size of tumor along with elevated IGF1 levels and non GH suppression with OGTT. She also noticed increased ring size and shoe size along with increased night sweats.  Therefore she underwent an endoscopic transnasal transsphenoidal surgery by Dr. Jonna Munro and Dr. Myna Bright about 6 weeks for a complete resection of these pituitary adenoma.  Pathology results stained positive for GH .  Her surgery went well without any complications.  Today she states is doing well and has established with a local endocrinologist and an ENT surgeon. Pt denies any headaches, denies any sinonasal complaints.  Denies any dizziness lightheadedness, no polyuria polydipsia.  She complains of mild nasal congestion but he states has not been able to use the [facility] for about 3 days.    Past Medical History  Past Medical History   Diagnosis Date   . Insomnia    . Anxiety    . Bipolar disorder ([place])    . Migraine    . Hyperlipidemia    . Depression    . IBS (irritable bowel syndrome)    . Pituitary adenoma ([place])    . Allergic rhinitis due to allergen    . Headache        Past Surgical History  Past Surgical History   Procedure Laterality Date   . Cesarean delivery only       1988, 1989   . Unlisted procedure foot/toes       spur excision   . Anes; shoulder arthroscopy/surgery     . Unlisted procedure foot/toes Left      Baxters never release   . Tonsillectomy & adenoidectomy <age 31     . Anes; knee area surgery     . Total abdominal hysterect w/wo rmvl tube ovary     . Cholecystectomy     . Endoscopy         Medications  Current Outpatient Prescriptions   Medication Sig Dispense Refill   . Biotin 1 MG Oral Cap Take by  mouth daily.     . Clobetasol Propionate 0.05 % External Solution 2 times a day.     . Cyanocobalamin (B-12) 2500 MCG Oral Tab      . Estradiol (VIVELLE-DOT) 0.1 MG/24HR Transdermal PATCH BIWEEKLY Place 1 patch on the skin 2 times a week.     . Hydroxychloroquine Sulfate 200 MG Oral Tab Take 200 mg by mouth daily.     . HydrOXYzine HCl 25 MG Oral Tab Take 25 mg by mouth at bedtime.     . Lidocaine 5 % External Patch Apply 1 patch onto the skin every 24 hours. Remove patch(s) after 12 hours.     Marland Kitchen LORazepam 0.5 MG Oral Tab Take 0.5 mg by mouth.     . Milk Thistle 200 MG Oral Cap      . Misc Natural Products (PUMPKIN SEED OIL OR)      . Multiple Vitamins-Minerals (MULTIVITAMIN ADULT OR) Take by mouth daily.     . Niacin 500 MG Oral Tab Take 500 mg by mouth.     . Propranolol HCl 20 MG Oral Tab Take 20 mg by mouth 2  times a day.     . Rhodiola rosea (RHODIOLA OR) Take 120 mg by mouth.     . Turmeric, Curcuma Longa, Powder Use 750 mg daily.     Marland Kitchen Unclassified (OTHER MEDS, SEE COMMENTS,) Essential oils:   Lavender, peppermint, lemon, lemon balm, citrus, cinnamon, thyme, frankincense     . Zolpidem Tartrate 10 MG Oral Tab Take 10 mg by mouth at bedtime as needed for sleep.       No current facility-administered medications for this visit.       Allergies  Review of patient's allergies indicates:  Allergies   Allergen Reactions   . Quetiapine Fumarate Rash       Family History  Family History   Problem Relation Age of Onset   . Hypertension Mother    . Alcohol Abuse Father    . Depression Brother    . Drug Abuse Brother    . Crohn's Disease Brother    . Cancer Brother      Kidney (removed), Lung, Liver   . Cancer Maternal Aunt      Brain   . Heart Disease Maternal Aunt    . Mental Retardation Maternal Aunt    . Melanoma Maternal Aunt    . Cancer Paternal Aunt      Gastric    . Cancer Maternal Grandmother      Cervical   . Heart Disease Maternal Grandfather    . Cancer Paternal Grandmother      Breast       Social  History  Social History     Social History   . Marital Status: Married     Spouse Name: N/A   . Number of Children: N/A   . Years of Education: N/A     Occupational History   . Not on file.     Social History Main Topics   . Smoking status: Never Smoker    . Smokeless tobacco: Never Used   . Alcohol Use: 0.6 oz/week     1 Glasses of wine per week   . Drug Use: No   . Sexual Activity: Not on file     Other Topics Concern   . Not on file     Social History Narrative    She lives in [place], she is disabled, she used to be a Community education officer at [employer],. Married with two children. No smoking.        Review of Systems  A 14 system ROS was filled out by the patient, personally reviewed, and is consistent with that in the HPI, PMH, PSH.    OBJECTIVE    Physical Exam  PHYSICAL EXAM:  BP 117/75 mmHg  Pulse 77  Temp(Src) 98.6 F (37 C) (Temporal)  Ht 5' 7.5" (1.715 m)  Wt 158 lb (71.668 kg)  BMI 24.37 kg/m2  SpO2 100%  General Appearance: Pleasant, NAD, WH.   HEENT: Normocephalic. Neck has full active ROM.  Jaw mobility is intact.   Respiratory: Normal respiratory effort and chest wall movement with respiration.   Neurologic: A&O to person, place, time and event. Speech is clear and articulated.Pupils equal, round, and reactive to light. Extraocular movements intact. Face is symmetric, sensation is intact bilaterally.  Hearing intact to finger bilaterally. Palate elevation symmetric. Uvula midline. Tongue protrusion midline. Shoulders shrug 5/5 bilaterally .No pronator drift.  Strength 5/5 in upper and lower extremities. Sensation was intact to light touch in upper and lower extremities. Steady gait with normal steps, base,  arm swing, and turning.  Psychiatric: Mood and affect are appropriate throughout interview. Pt cooperates well during exam.      IMAGING  Mri Brain Wo/w Contrast    02/17/2015  IMPRESSION: Status post resection of pituitary macroadenoma, with no definite evidence of residual/recurrent  adenoma.      ASSESSMENT/PLAN  Carrie Mathis is a very pleasant 45 year old female who was found to have a pituitary adenoma staining positive for growth hormone and underwent a endoscopic trans-nasal transsphenoidal surgery by Dr. Jonna Munro and Dr. Myna Bright about 6 weeks ago.  Today patient is doing well she is in good spirits, and is happy with her surgical outcome.  She she'll have a recheck of her IGF-I and OGTT, with a growth hormone which could be done by her local endocrinologist.  If her numbers are within normal limits there isn't any further need for imaging.   She is more than welcome to follow-up with [place] but would prefer to continue with her providers in [place].    I spent greater than 50% of this 15 minute clinic encounter in face-to-face counseling regarding follow up plan, imaging review, and coordination of care.   Thank you very much for allowing me to participate in Mrs. Hertling care , please contact my office with any questions or concerns.

## 2015-03-13 ENCOUNTER — Telehealth (HOSPITAL_BASED_OUTPATIENT_CLINIC_OR_DEPARTMENT_OTHER): Payer: Self-pay | Admitting: Neurological Surgery

## 2015-03-13 NOTE — Telephone Encounter (Signed)
Dx: pituitary adenoma s/p eTSS on 01/09/2015. Last evaluated by Jonna Munro MD and DeSantis MD on 02/17/2015. According to provider notes patient is to do the following:    DeSantis MD:  "Will follow with Dr Wardell Heath in [place], strong evidence of cure clinically and biochemically. Would recommend OGTT. Annual IGF-1, return only if IGF-1 increasing"    Jonna Munro MD:  "She she'll have a recheck of her IGF-I and OGTT, with a growth hormone which could be done by her local endocrinologist. If her numbers are within normal limits there isn't any further need for imaging."    RN contacted patients local Endocrinologist office, Dr. Wardell Heath, and spoke with [facility] RN. Tammy stated that patient is scheduled for f/u on 04/10/2015 with MD and requested clinic notes from [facility] be faxed to them to set patient up for OGTT and IGF1 lab draw at scheduled visit. RN faxed records as requested. Will f/u after scheduled clinic visit to ensure results were WNL. No further questions or concerns at this time, call back number given per fax cover sheet.    [facility]  Dr. Wardell Heath  P. [phone]  F. [phone]

## 2015-04-21 NOTE — Telephone Encounter (Addendum)
Pt completed OGTT on 04/22/2015 and last IGF1 on 02/03/2015. RN received records from [facility] and scanned them into media and relayed to Marina Gravel and DeSantis MD for review and f/u plan. No further questions or concerns at this time. Call back number given.

## 2015-05-06 NOTE — Telephone Encounter (Signed)
Agreed, she is a cure. She should have IGF-1 measured annually through her PCP to survey for recurrence, sooner if symptoms of acromegaly develop.    Thanks  Paulita Cradle

## 2015-05-07 NOTE — Telephone Encounter (Signed)
RN called patient to relay information from Dr. Hinton Lovely regarding f/u PRN. Left VM with call back number. No further questions or concerns at this time.

## 2020-06-26 IMAGING — CT CT BRAIN WO CONTRAST
3 series · 14 of 47 positions shown, 16 images · non-contrast
Comparison: None

HISTORY: 49-year-old female with headache, dizziness and giddiness.
TECHNIQUE: CT of the head was obtained without contrast. Dose reduction technique used: Automated exposure control and adjustment of the mA and/or kV according to patient size. CT count in previous 12-months: 0.

[Series 4: head w/o soft tissue · axial · non-contrast · 0.39mm/px · z∈[-573,-447]mm · 8 of 56 slices shown, 10 images]
[im 4/56  brain]
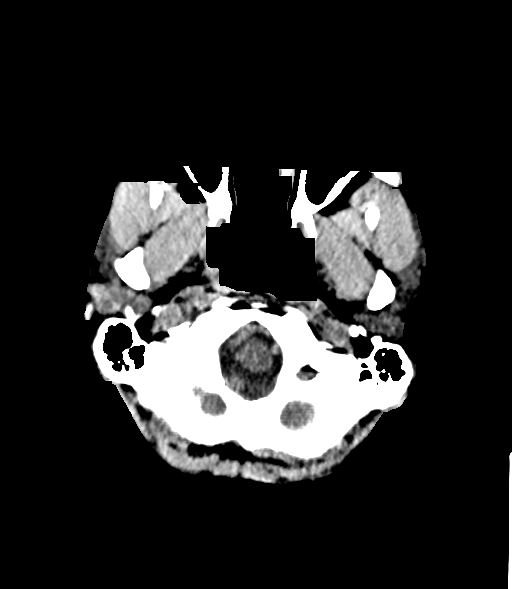
[im 4/56  bone]
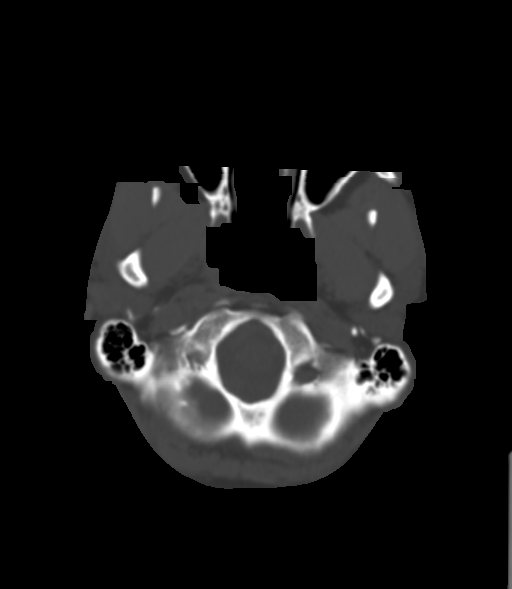
[im 12/56  brain]
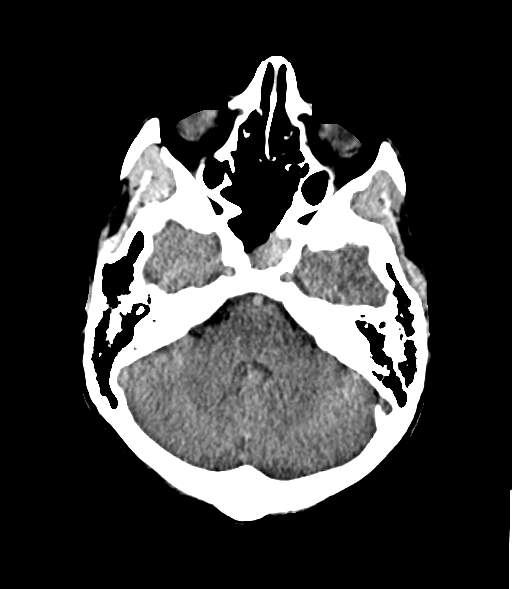
[im 18/56  brain]
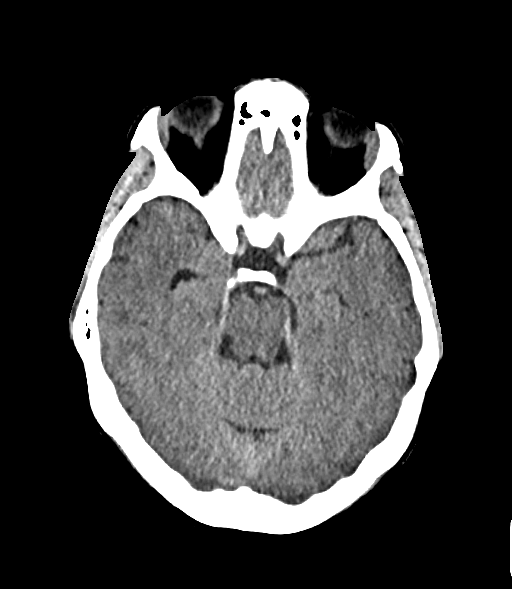
[im 25/56  brain]
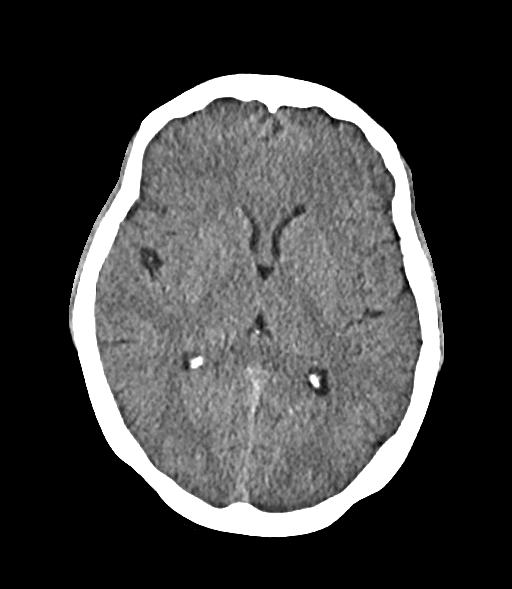
[im 31/56  brain]
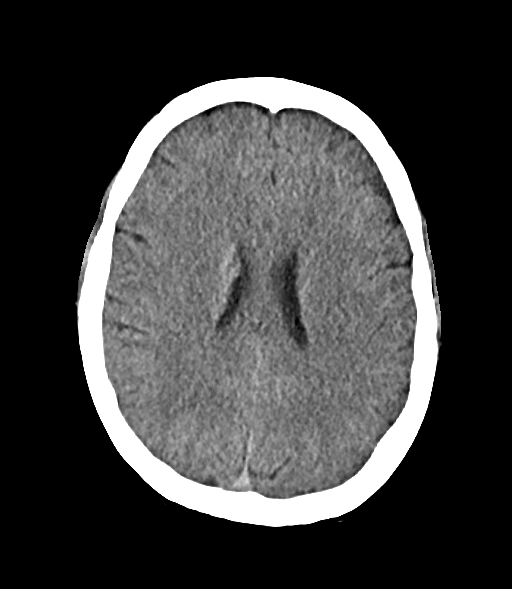
[im 31/56  bone]
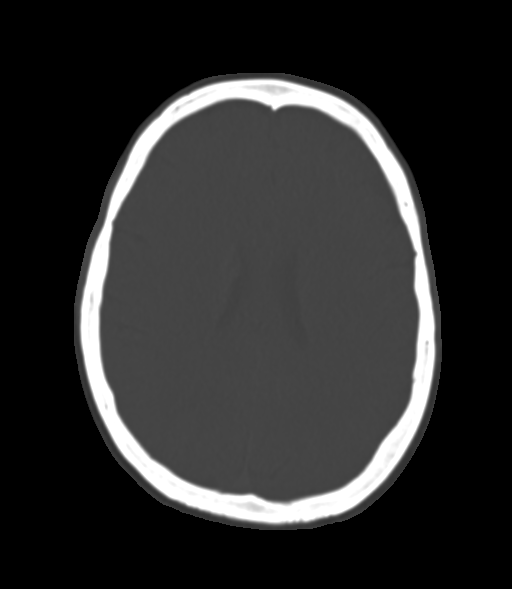
[im 38/56  brain]
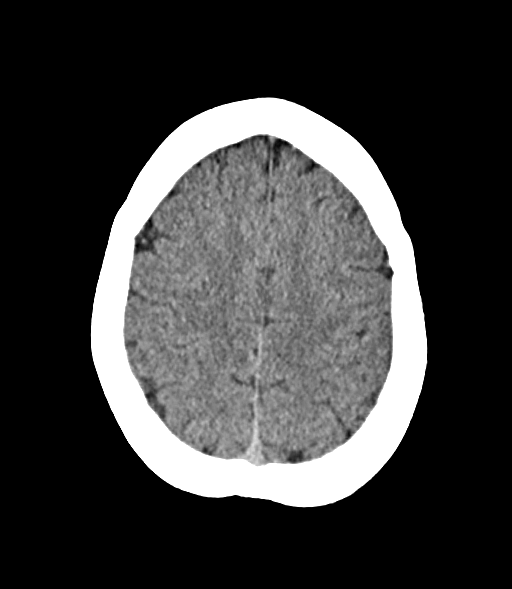
[im 44/56  brain]
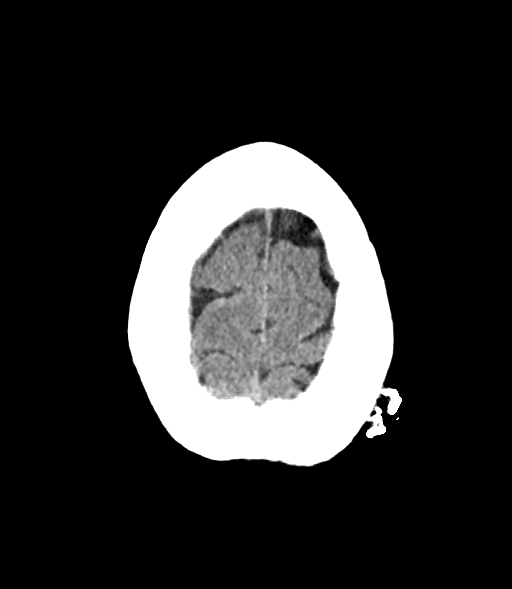
[im 52/56  brain]
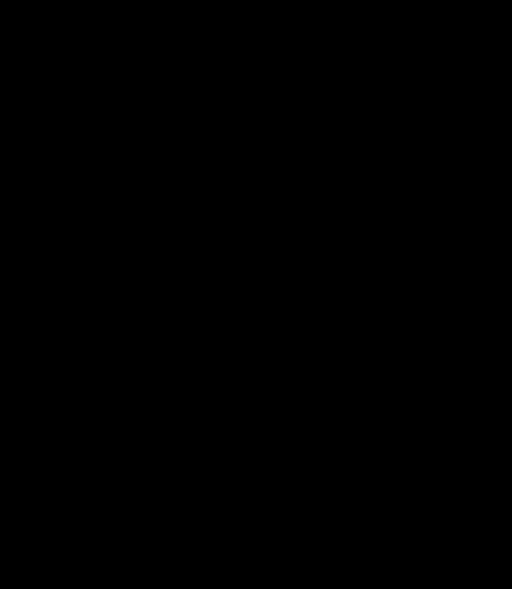

[Series 8: cor · coronal · 0.36mm/px · 3 of 66 slices shown]
[im 24/66  brain]
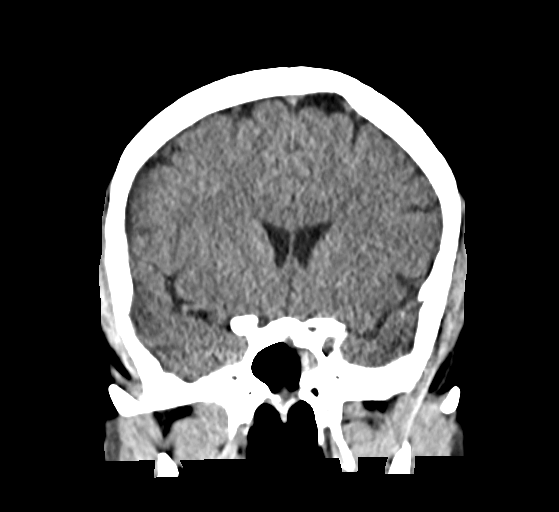
[im 30/66  brain]
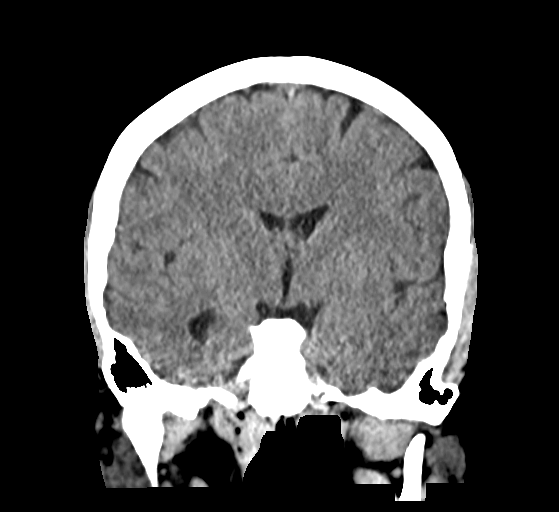
[im 36/66  brain]
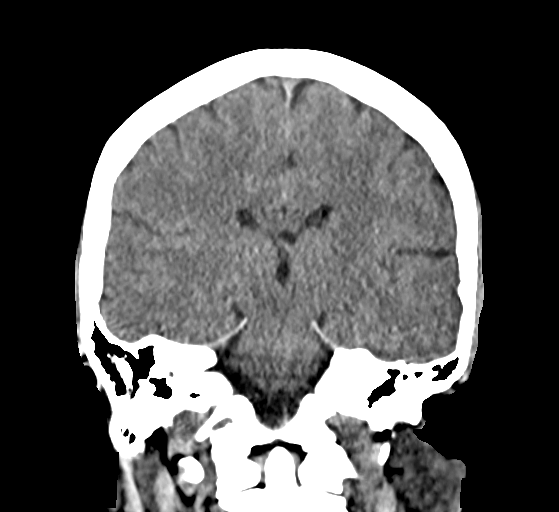

[Series 10: sag · sagittal · 0.36mm/px · 3 of 57 slices shown]
[im 19/57  brain]
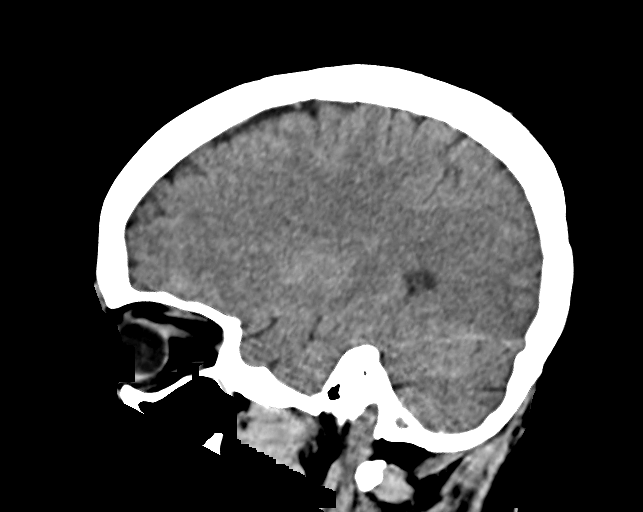
[im 29/57  brain]
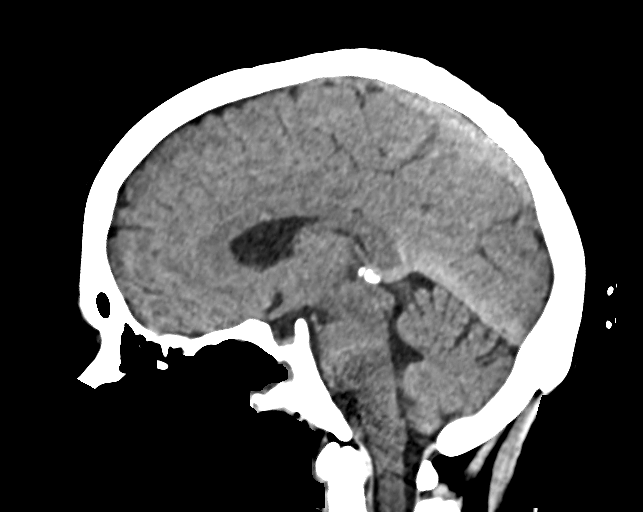
[im 38/57  brain]
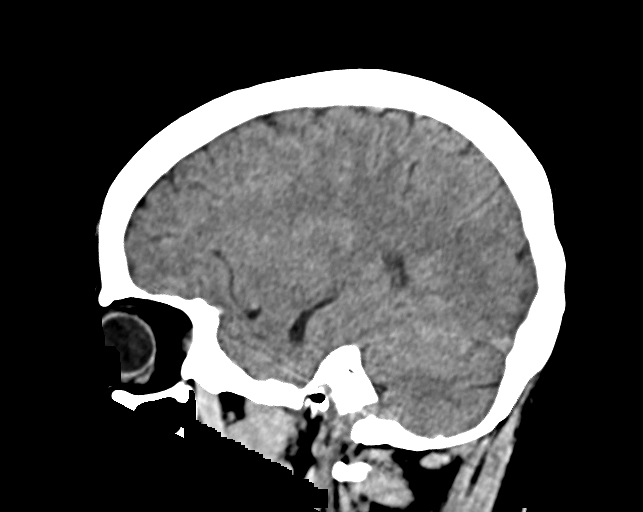

[14 of 47 positions shown; findings below may reference images not displayed]

FINDINGS: BRAIN PARENCHYMA: There is normal differentiation of the gray-white matter.

HEMORRHAGE: No acute intracranial hemorrhagic products.

EXTRA-AXIAL SPACES: No intra- or extra-axial fluid collections. Basilar cisterns are maintained.

VENTRICULAR SYSTEM: There is relative asymmetric effacement of the temporal horn of the left lateral ventricle, which may be a normal variant or related to mass effect or, alternatively, entrapment of the right temporal horn.

BONES: The calvarium is intact.  Visualized paranasal sinuses and mastoid air cells are clear.
IMPRESSION: Relative asymmetric effacement of the temporal horn of the left lateral ventricle, which may be a normal variant or related to mass effect. MRI of the brain without and with contrast can be helpful in further characterization.

Total radiation dose to patient is CTDIvol 34.30 mGy and DLP 667.00 mGy-cm.

## 2020-07-02 IMAGING — MR MRI BRAIN W/WO CONTRAST
13 series · 48 of 48 positions shown · IV contrast (15CC PROHANCE)
Comparison: CT of brain 06/26/2020.

INDICATION: 49-year-old female with head injury, headaches and dizziness.
TECHNIQUE: Multiplanar, multisequence MRI of the brain was performed without and with intravenous contrast. The patient received an intravenous dose of 15 mL of ProHance.

[Series 1: bSSFP · axial · 8.0mm · 1.17mm/px · 1 of 19 slices shown]
[im 1/19]
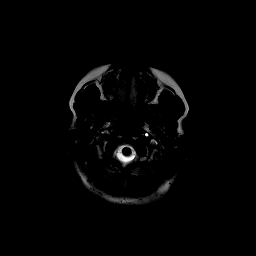

[Series 2: t1_mprage_axial · axial · 1.0mm · 1.00mm/px · z∈[-65,+93]mm · 5 of 160 slices shown]
[im 1/160]
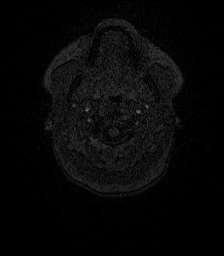
[im 40/160]
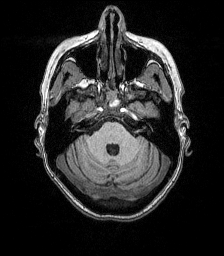
[im 80/160]
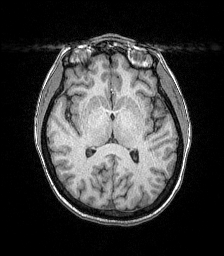
[im 120/160]
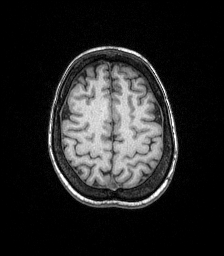
[im 160/160]
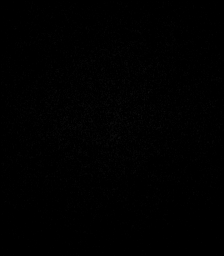

[Series 3: flair_axial_fs · axial · 5.0mm · 0.45mm/px · 1 of 26 slices shown]
[im 1/26]
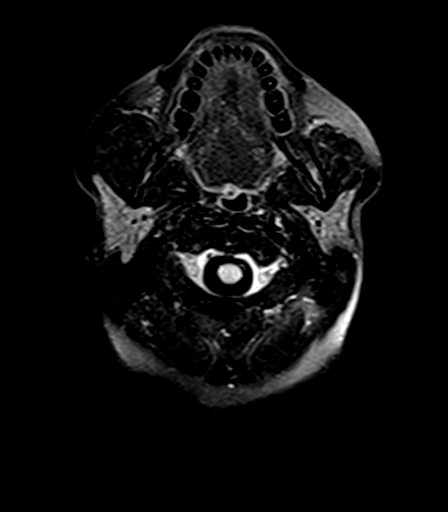

[Series 4: t2_axial · axial · 5.0mm · 0.72mm/px · 1 of 26 slices shown]
[im 1/26]
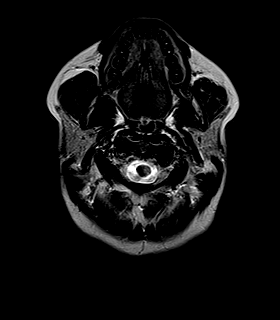

[Series 5: DWI · axial · 5.0mm · 1.26mm/px · 1 of 26 slices shown (1 of 2)]
[im 1/26]
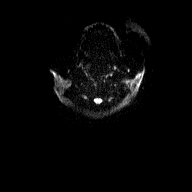

[Series 6: DWI · axial · 5.0mm · 1.26mm/px · 1 of 26 slices shown (2 of 2)]
[im 1/26]
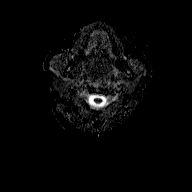

[Series 7: flash_axial · axial · 5.0mm · 0.45mm/px · 1 of 26 slices shown]
[im 1/26]
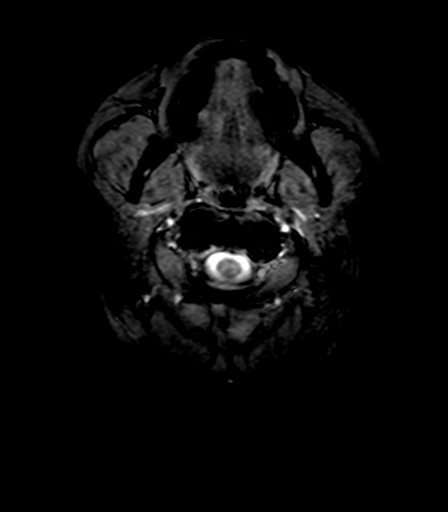

[Series 8: t1_mprage_axial_+c · axial · 1.0mm · 1.00mm/px · z∈[-65,+93]mm · 5 of 160 slices shown]
[im 1/160]
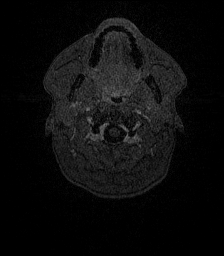
[im 40/160]
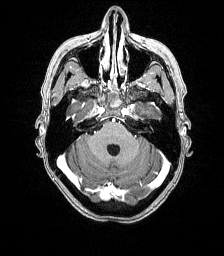
[im 80/160]
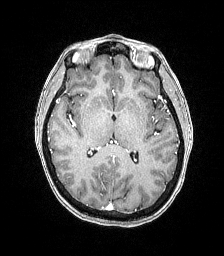
[im 120/160]
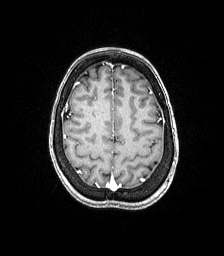
[im 160/160]
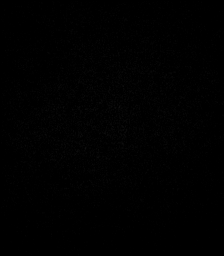

[Series 9: sub_s8-s2_1 · axial · 1.0mm · 1.00mm/px · z∈[-65,+93]mm · 6 of 160 slices shown]
[im 1/160]
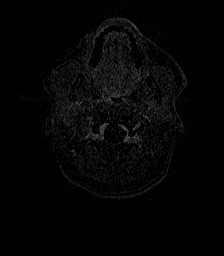
[im 32/160]
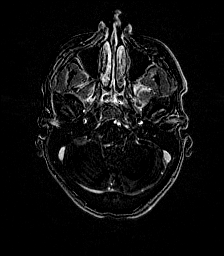
[im 64/160]
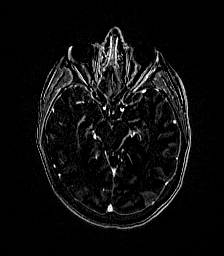
[im 96/160]
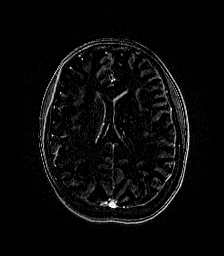
[im 128/160]
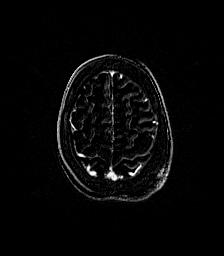
[im 160/160]
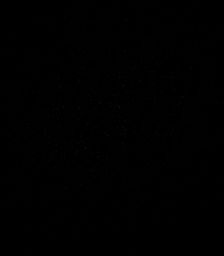

[Series 10: t1_mprage_cor_reformat · coronal · 1.5mm · 1.00mm/px · 7 of 194 slices shown]
[im 1/194]
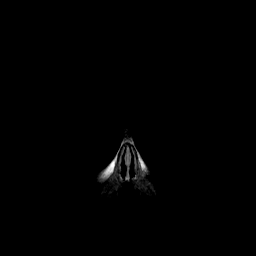
[im 33/194]
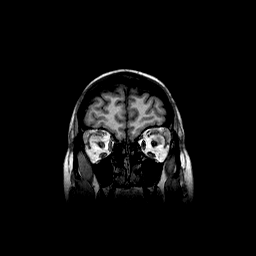
[im 65/194]
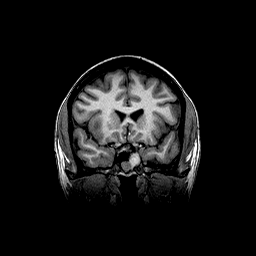
[im 97/194]
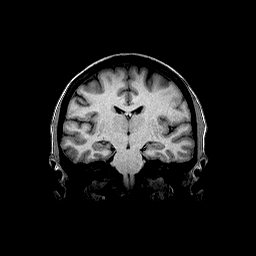
[im 129/194]
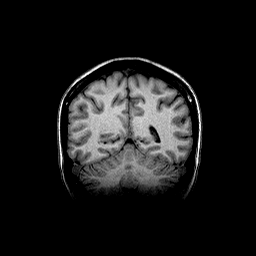
[im 161/194]
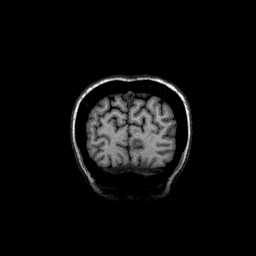
[im 194/194]
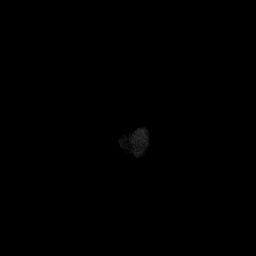

[Series 11: t1_mprage_sag_reformat · sagittal · 1.5mm · 1.00mm/px · 6 of 162 slices shown]
[im 1/162]
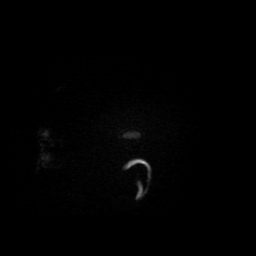
[im 33/162]
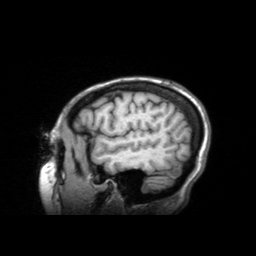
[im 65/162]
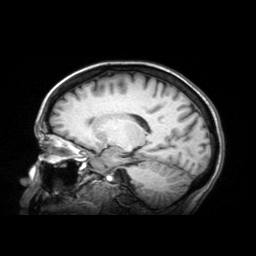
[im 97/162]
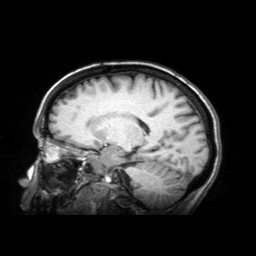
[im 129/162]
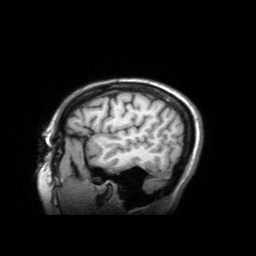
[im 162/162]
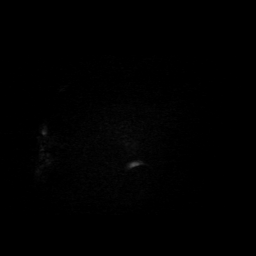

[Series 12: t1_mprage_sag_reformat_+c · sagittal · 1.5mm · 1.00mm/px · 6 of 163 slices shown]
[im 1/163]
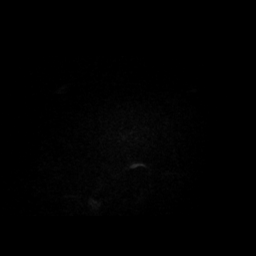
[im 33/163]
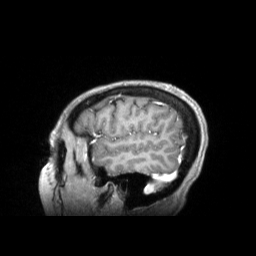
[im 65/163]
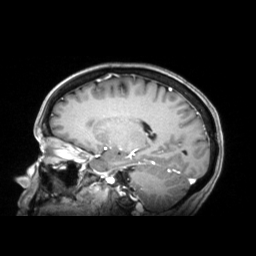
[im 98/163]
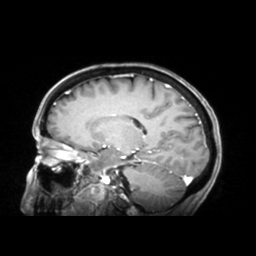
[im 130/163]
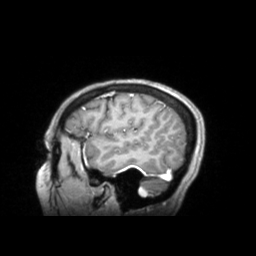
[im 163/163]
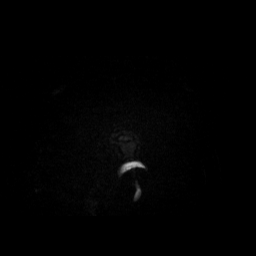

[Series 13: t1_mprage_cor_reformat_+c · coronal · 1.5mm · 0.91mm/px · 7 of 194 slices shown]
[im 1/194]
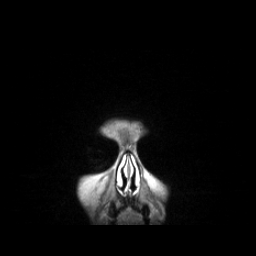
[im 33/194]
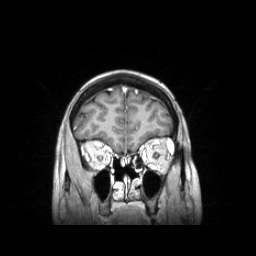
[im 65/194]
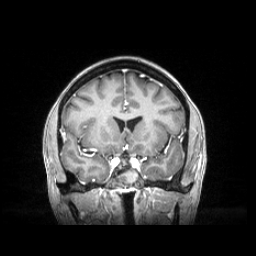
[im 97/194]
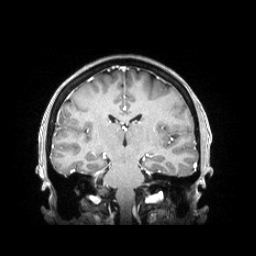
[im 129/194]
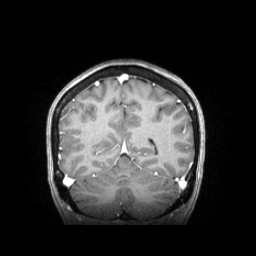
[im 161/194]
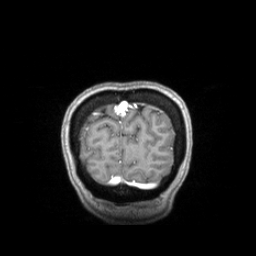
[im 194/194]
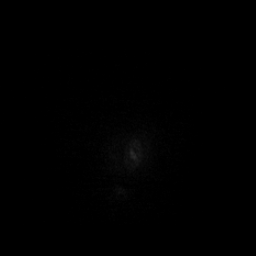

[48 of 48 positions shown; findings below may reference images not displayed]

FINDINGS: Brain parenchyma: No acute infarct or hemorrhage. No significant parenchymal abnormality.

Ventricles: Effacement of the temporal horn of the left lateral ventricle without adjacent mass, likely anatomic variant. No midline shift, herniation or hydrocephalus. Incidental partially empty sella.

Extra-axial spaces: No extra-axial fluid collection.

Extracranial structures: Postsurgical changes from sinonasal surgery including partial ethmoidectomies, sphenoidotomies and resection of the posterior nasal septum. Moderate mucosal thickening in the left sphenoid sinus with heterogeneous signal contents, likely inspissated secretions. Minimal mucosal thickening of residual ethmoid air cells and bilateral maxillary sinuses. No suspicious osseous lesion. Soft tissues normal.

Contrast-enhanced views are negative for pathologic enhancement.
IMPRESSION: Effacement of the temporal horn of the left lateral ventricle without adjacent mass consistent with an anatomic variant.

## 2021-02-22 IMAGING — US US SOFT TISSUE NECK/THYROID
1 series · 14 of 25 positions shown · non-contrast
Comparison: None.

REASON FOR EXAM: Multinodular goiter.

[Series 1: us soft tissue neck/thyroid · 54 acquisitions, 14 frames shown]
[im 1/54]
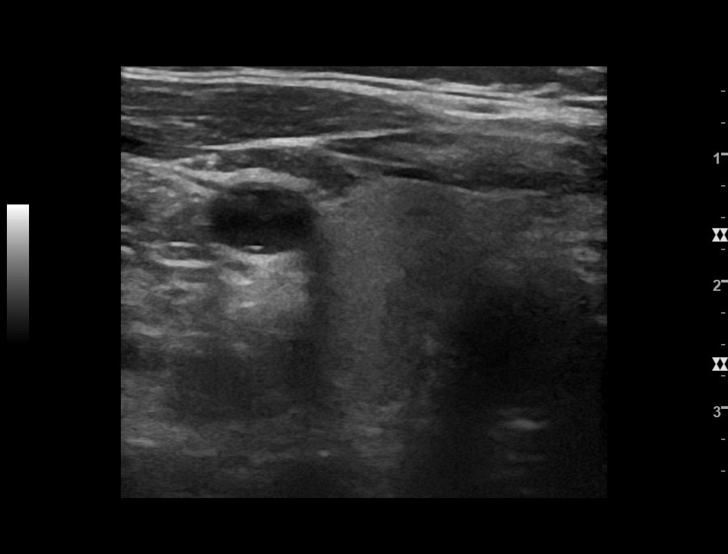
[im 5/54]
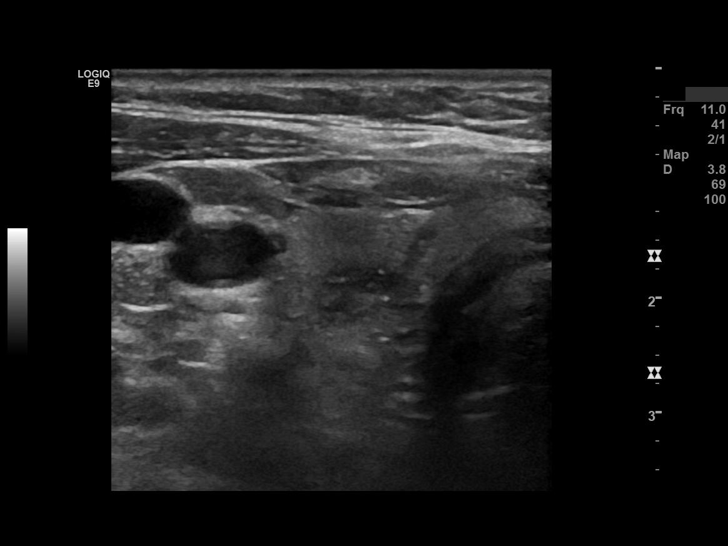
[im 9/54]
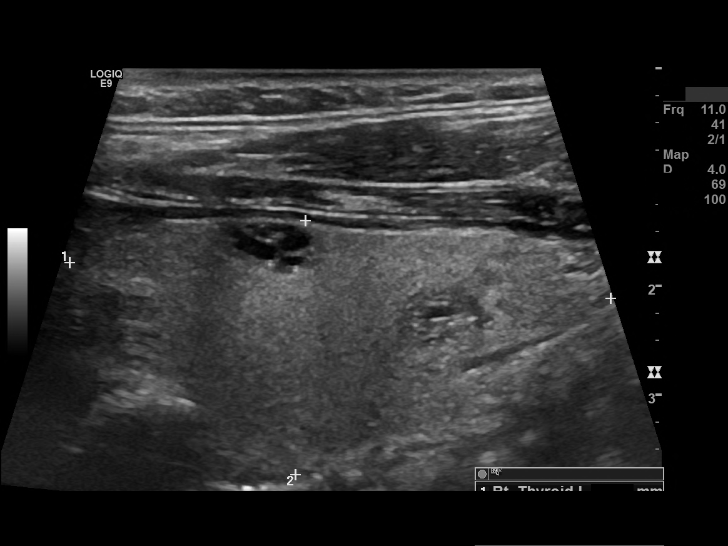
[im 14/54]
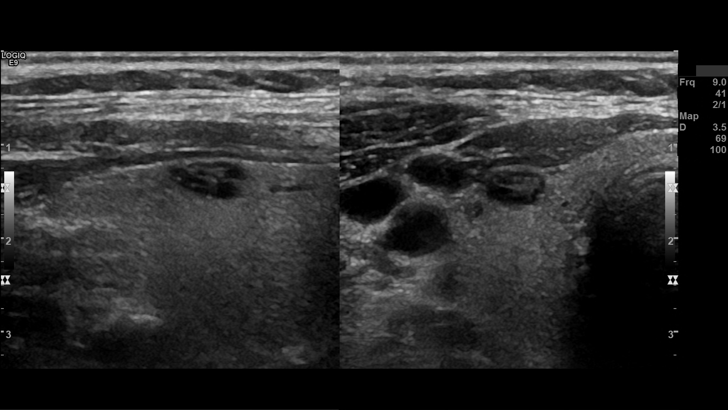
[im 18/54]
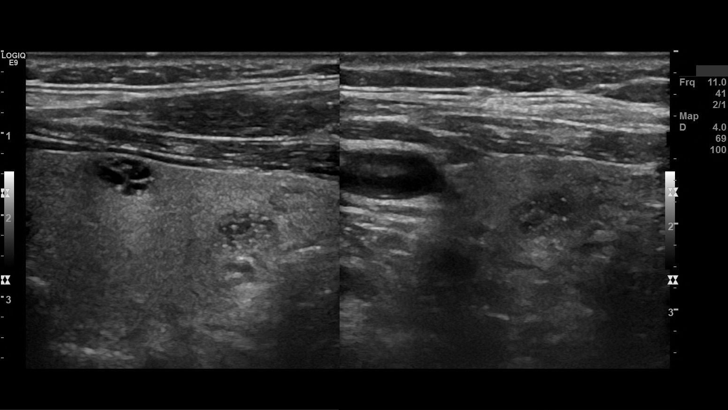
[im 20/54]
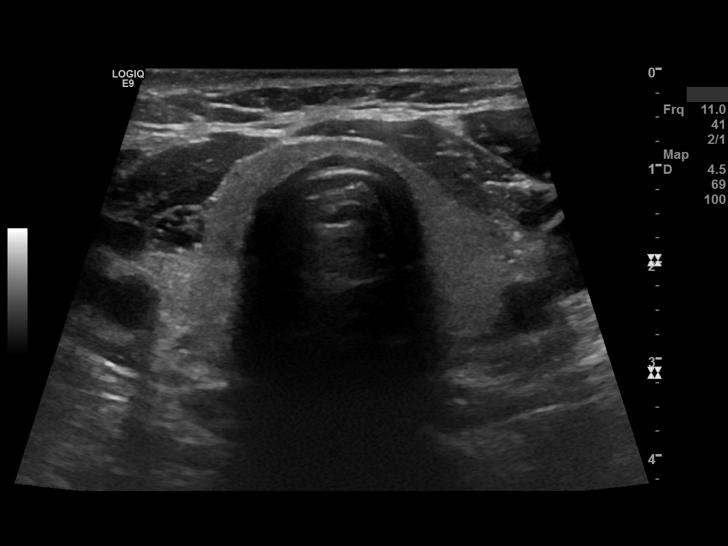
[im 25/54]
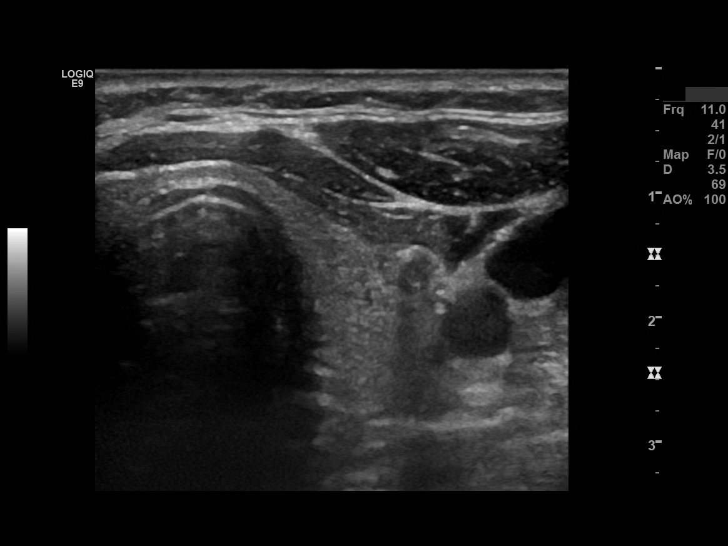
[im 29/54]
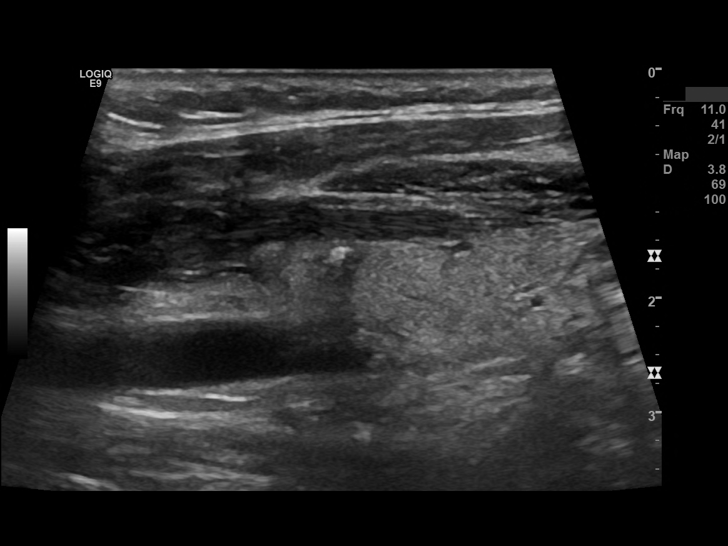
[im 34/54]
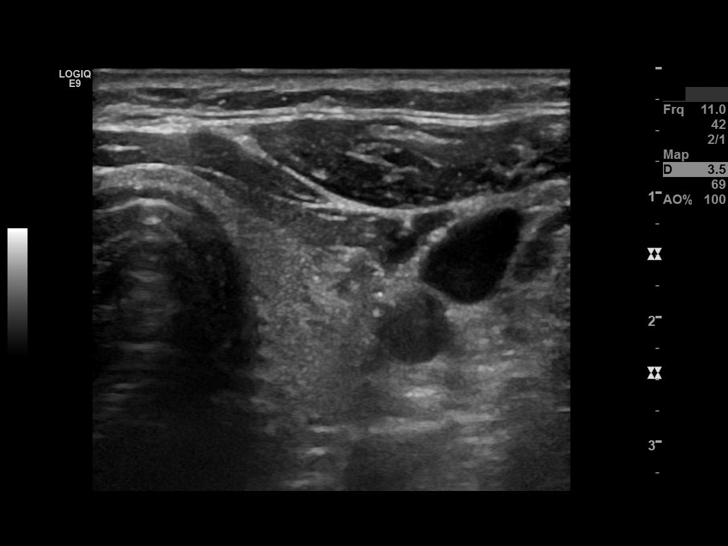
[im 36/54]
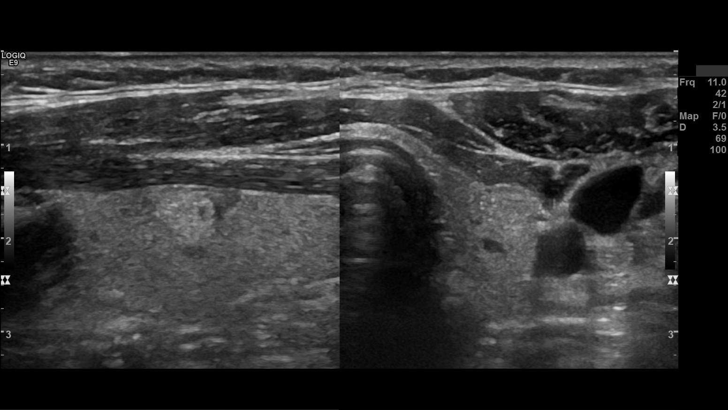
[im 40/54]
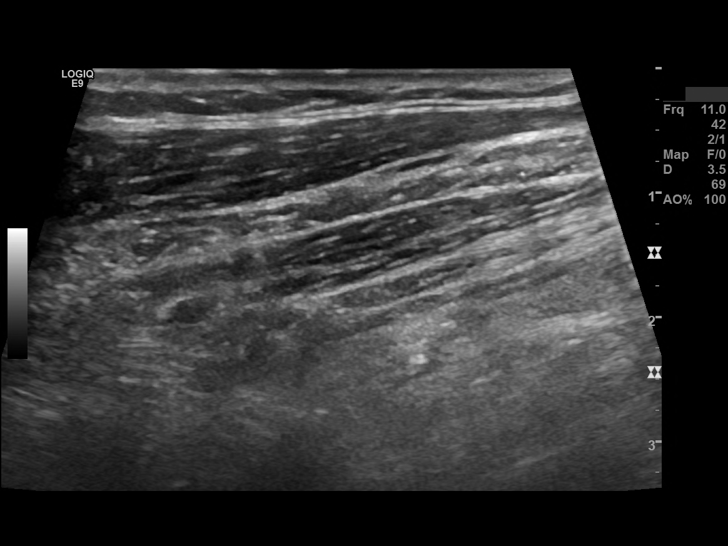
[im 45/54]
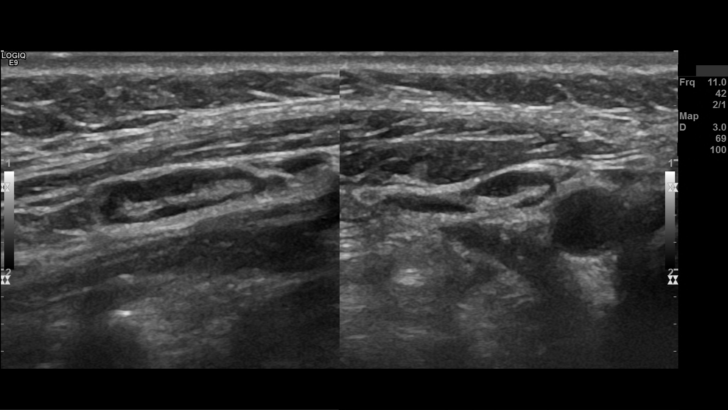
[im 49/54]
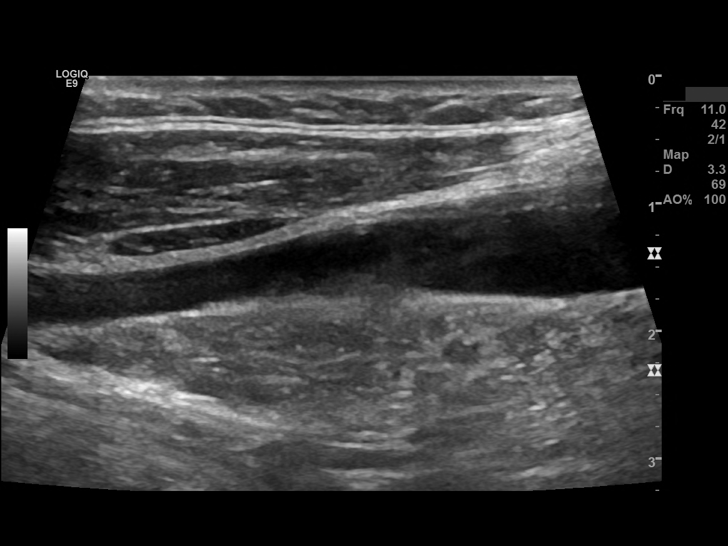
[im 54/54]
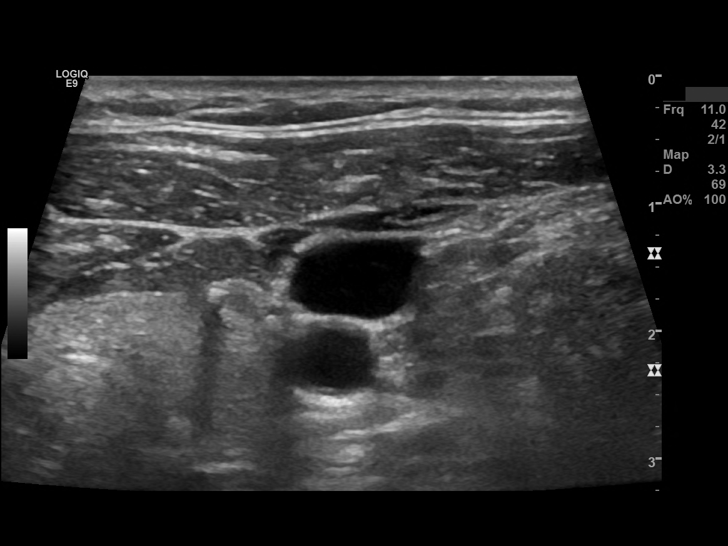

[14 of 25 positions shown; findings below may reference images not displayed]

TECHNIQUE AND FINDINGS: Multiple ultrasound images of the thyroid gland were obtained. The right lobe of the thyroid measures 5.0 x 2.3 x 1.6 cm and has a normal background echotexture. It contains a complex or mixed echo nodule measuring 9 x 8 x 5 mm anteriorly. A second mostly solid nodule with punctate echoes is seen inferiorly measuring 8 x 8 x 6 mm.

The thyroid isthmus measures 3 mm in thickness.

The left lobe of the thyroid measures 4.6 x 1.4 x 1.3 cm and has a normal background echotexture. It contains a solid, echogenic nodule anteriorly containing punctate internal echoes, measuring 9 x 6 x 5 mm.
IMPRESSION: 1. There is an 8 mm TR 4 right thyroid nodule. Annual follow-up is recommended.

2. There is a 9 mm TR 3 right thyroid nodule, annual follow-up is recommended.

3. There is also a TR 4 left thyroid nodule for which annual follow-up is also recommended.

ACR TI-RADS recommendations:

TR5 (>= 7 points) - FNA if > 1 cm, follow up if 0.5-0.9 cm every year for 5 years

TR4 (4-6 points) - FNA if >=1.5 cm, follow up if 1-1.4 cm in 1, 2, 3, and 5 years

TR3 (3 points) - FNA if 2.5 cm, follow up if 1.5-2.4 cm in 1, 3, and 5 years

TR2  (2 points) & TR1 (0 points) - No FNA or follow up.

Aujla, Jaylon, et al.  ACR Thyroid Imaging, Reporting and Data System (TI-RADS):  White Paper of the ACR TI-RADS Committee. J Am Coll Radiology 9444, 14(5) 587-595.

## 2021-05-27 IMAGING — MR MRI BRAIN W/WO CONTRAST
10 of 14 series · 33 of 48 positions shown · IV contrast (prohance)
Comparison: Brain MRI without and with contrast 07/02/2020

Images Obtained from [facility]
******** ADDENDUM #1 ********
ADDENDUM:
Additional previous images are available from 1412, 5582 and 7414. The previous pituitary mass was in the right aspect of the pituitary gland and is no longer visualized. Previously described
hypoenhancing tissue in the left pituitary gland may be artifactual or postoperative in etiology.
******** ORIGINAL REPORT ********
HISTORY: 50-year-old female with benign neoplasm of pituitary gland. Patient reports previous history for tumor removal.
TECHNIQUE: Multiplanar, multisequence MRI images of the brain are obtained prior to and following intravenous contrast.
CONTRAST: 15 mL of ProHance

[Series 1: bSSFP · axial · 8.0mm · 1.17mm/px · z∈[-43,+129]mm · 4 of 19 slices shown]
[im 1/19]
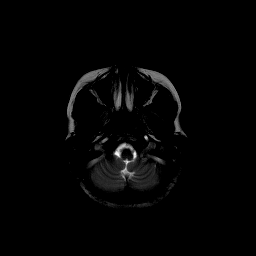
[im 7/19]
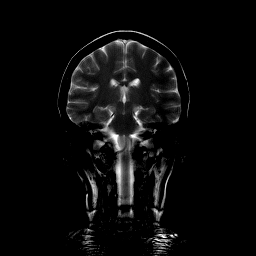
[im 13/19]
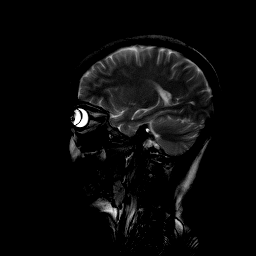
[im 19/19]
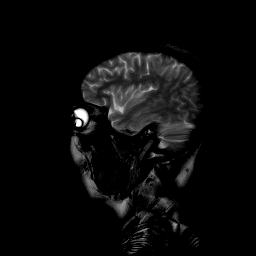

[Series 2: t1_sag · sagittal · 5.0mm · 0.75mm/px · 5 of 22 slices shown (1 of 2)]
[im 1/22]
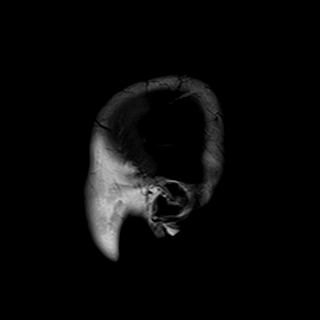
[im 6/22]
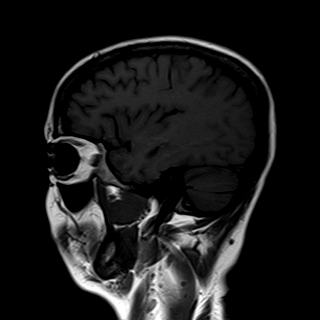
[im 11/22]
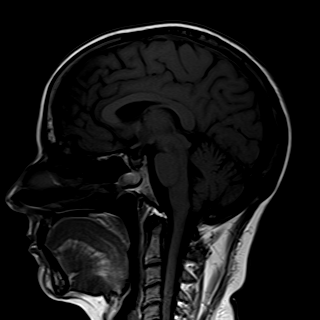
[im 16/22]
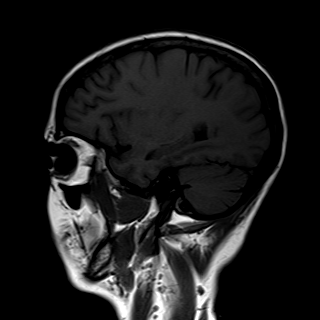
[im 22/22]
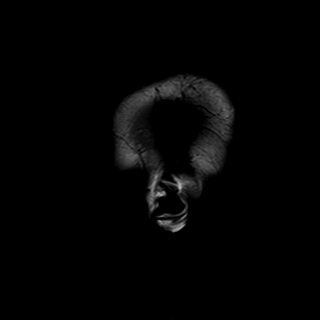

[Series 3: flair_axial_fs · axial · 5.0mm · 0.45mm/px · z∈[-65,+97]mm · 7 of 28 slices shown]
[im 1/28]
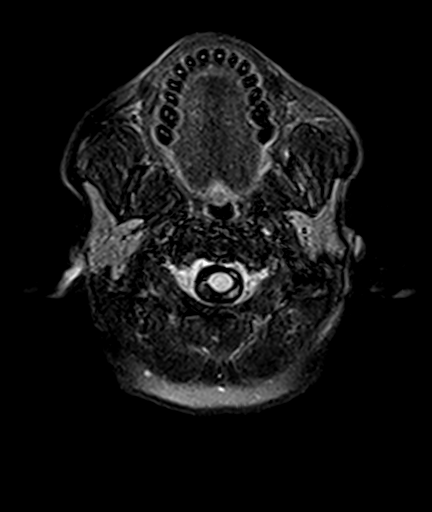
[im 5/28]
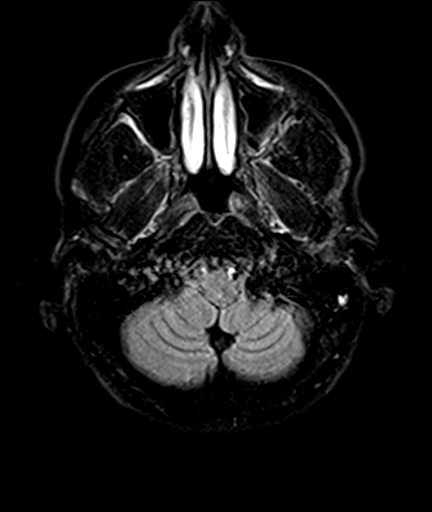
[im 10/28]
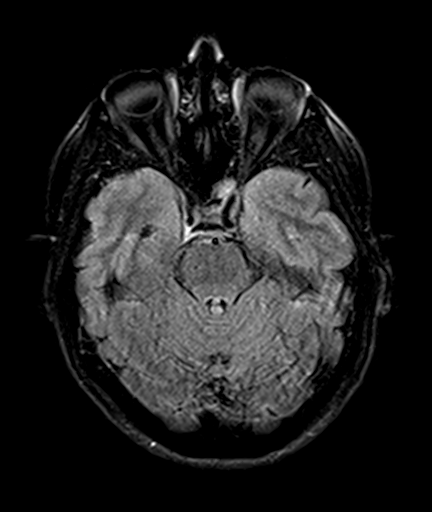
[im 14/28]
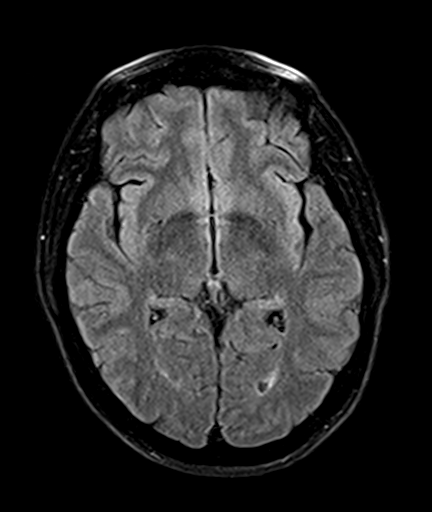
[im 19/28]
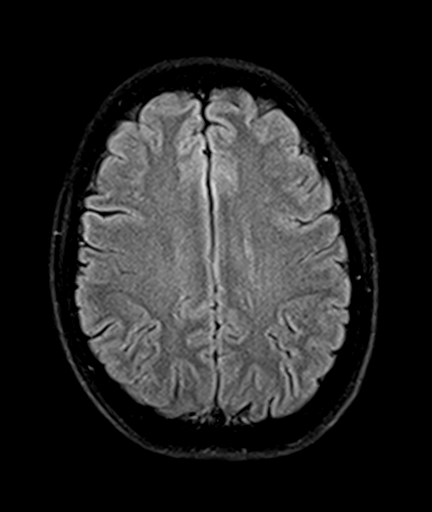
[im 23/28]
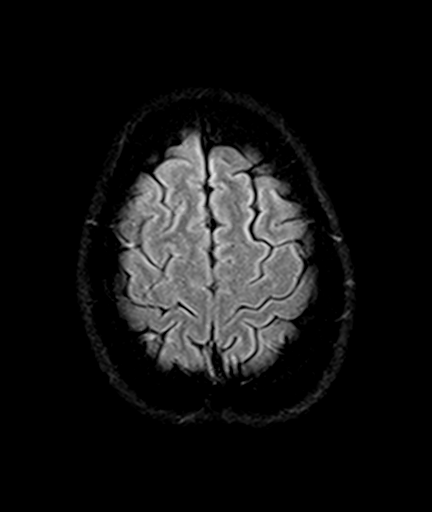
[im 28/28]
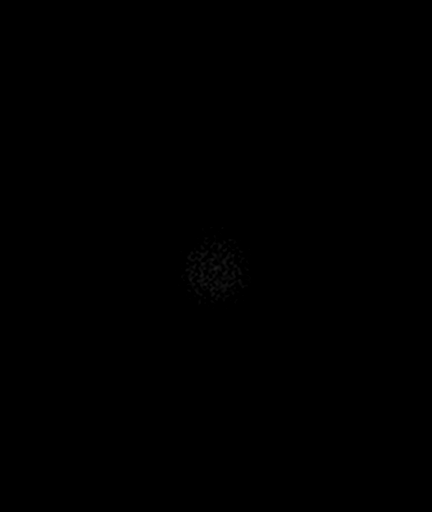

[Series 4: t2_cor_x2.5 · coronal · 3.0mm · 0.78mm/px · 4 of 15 slices shown]
[im 1/15]
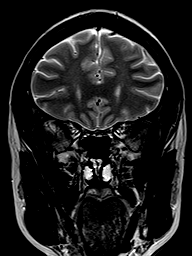
[im 5/15]
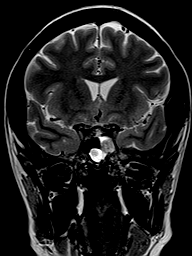
[im 10/15]
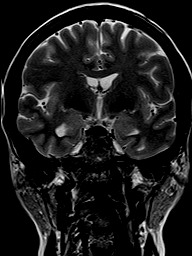
[im 15/15]
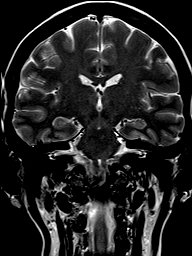

[Series 5: t1_sag · sagittal · 3.0mm · 0.44mm/px · 3 of 11 slices shown (2 of 2)]
[im 1/11]
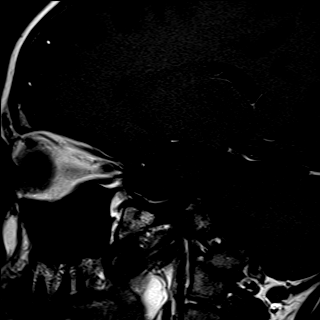
[im 6/11]
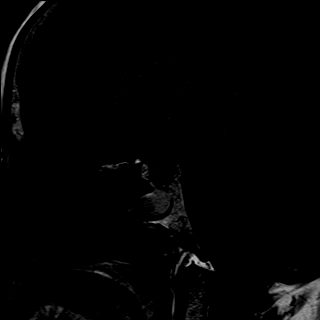
[im 11/11]
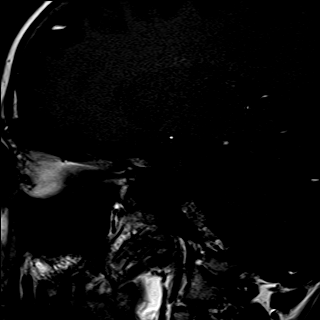

[Series 6: t1_cor_fs · coronal · 3.0mm · 0.78mm/px · 3 of 13 slices shown]
[im 1/13]
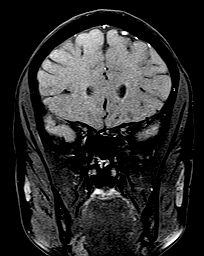
[im 7/13]
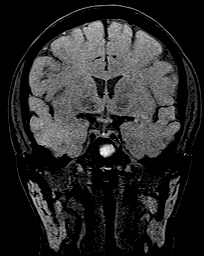
[im 13/13]
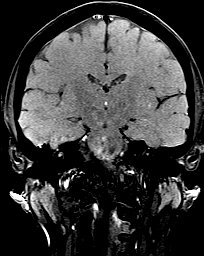

[Series 7: t1_cor · coronal · 3.0mm · 0.31mm/px · 2 of 9 slices shown]
[im 1/9]
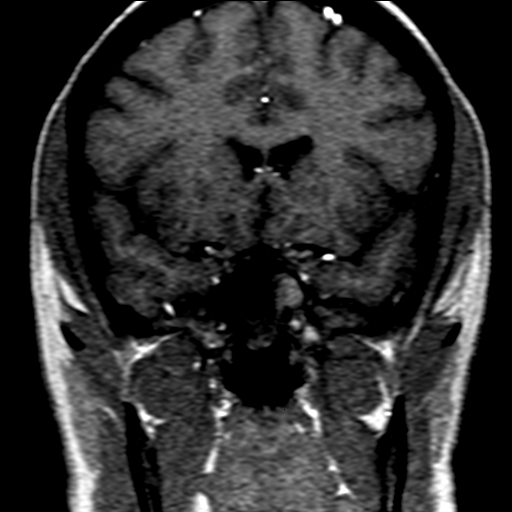
[im 9/9]
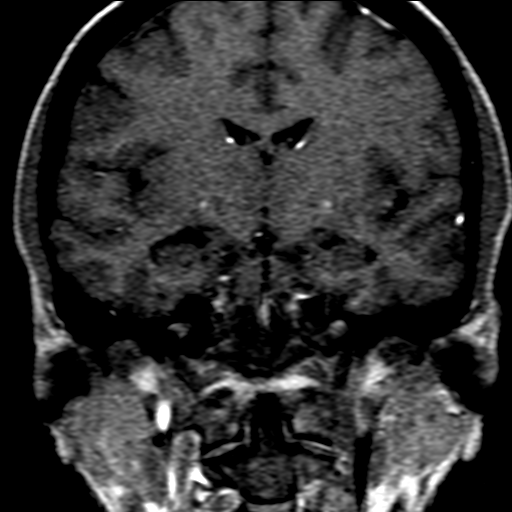

[Series 8: t1_cor_0 · coronal · 3.0mm · 0.31mm/px · 2 of 9 slices shown]
[im 1/9]
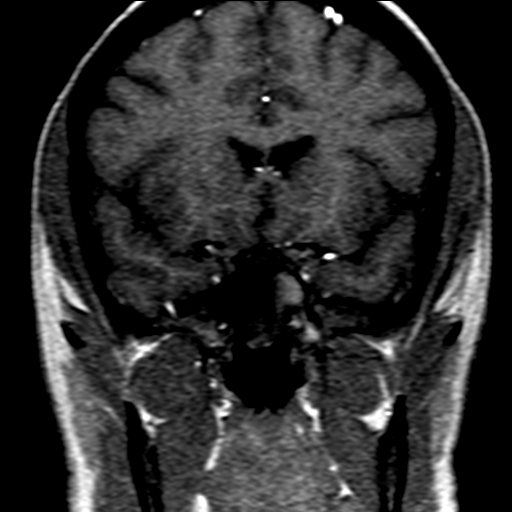
[im 9/9]
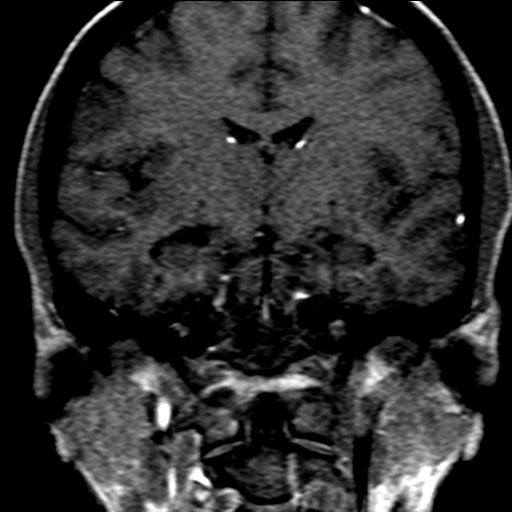

[Series 9: t1_cor_30 · coronal · 3.0mm · 0.31mm/px · 2 of 9 slices shown]
[im 1/9]
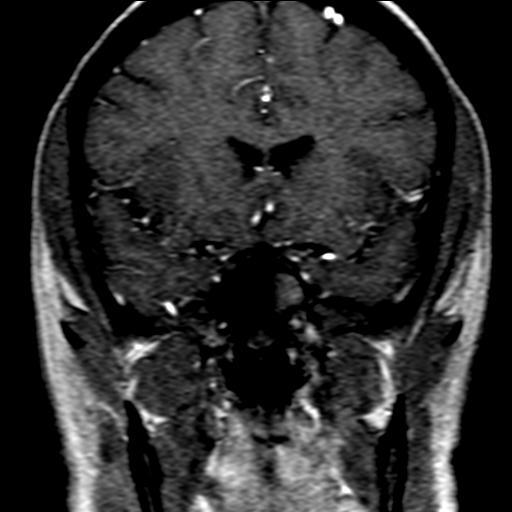
[im 9/9]
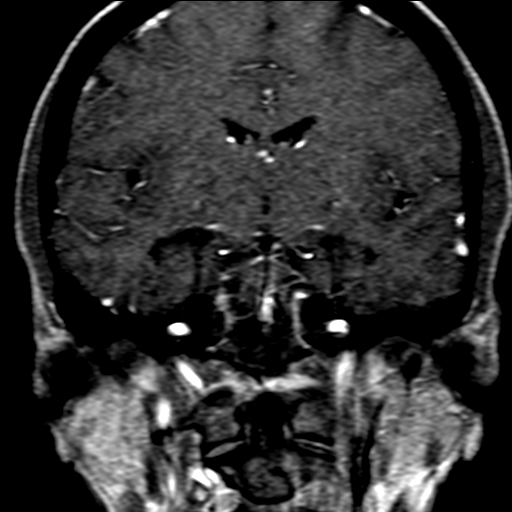

[Series 10: t1_cor_60 · coronal · 3.0mm · 0.31mm/px · 1 of 9 slices shown]
[im 1/9]
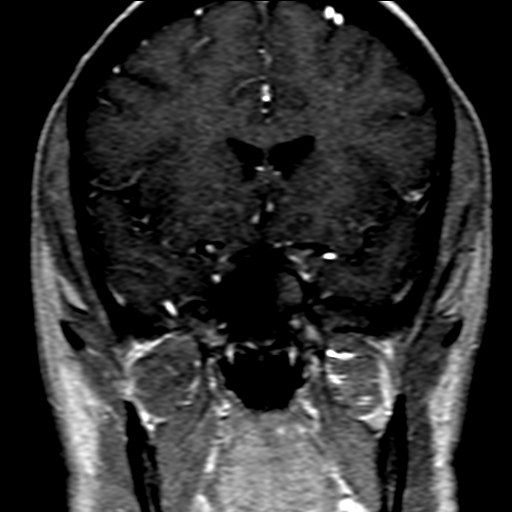

[33 of 48 positions shown; findings below may reference images not displayed]

FINDINGS: PITUITARY GLAND:
*  Possible hypoenhancing nodule in the left pituitary gland measuring 4 x 2 x 5 mm. Due to history of previous pituitary surgery, this may represent a scar or residual/recurrent pituitary adenoma.
OPTIC CHIASM:
*  Normal in appearance, without deviation or compression.
CAVERNOUS SINUSES:
*  Symmetric, without displacement or invasion.
OTHER:
*  Maxillary sinus disease.
IMPRESSION: Possible hypoenhancing nodule in the left pituitary gland measuring up to 5 mm. Due to history of previous pituitary surgery, this may represent a scar or residual/recurrent pituitary adenoma.

## 2021-07-23 IMAGING — MG MAMMO SCRN BIL W/CAD TOMO
8 series · 8 of 24 positions shown · non-contrast
Comparison: No prior imaging studies are available for comparison.

Images Obtained from [facility]
******** ADDENDUM #1 ********
ADDENDUM:
Please note, the following addendum after receipt of multiple prior
mammograms from [facility] [HOSPITAL] dated 07/11/19, 06/30/18, 05/23/17, 06/07/16 and 06/05/15.
No change to the findings or impression.
BI-RADS Category 1: Negative
******** ORIGINAL REPORT ********
INDICATION: Screening.
TECHNIQUE: Bilateral 2-D digital screening mammogram was performed followed by 3-D tomosynthesis.  Current study was also evaluated with a computer aided detection (CAD) system.
MAMMOGRAM FINDINGS:
There are scattered areas of fibroglandular density.
No suspicious abnormality is seen in either breast.

[L MLO]
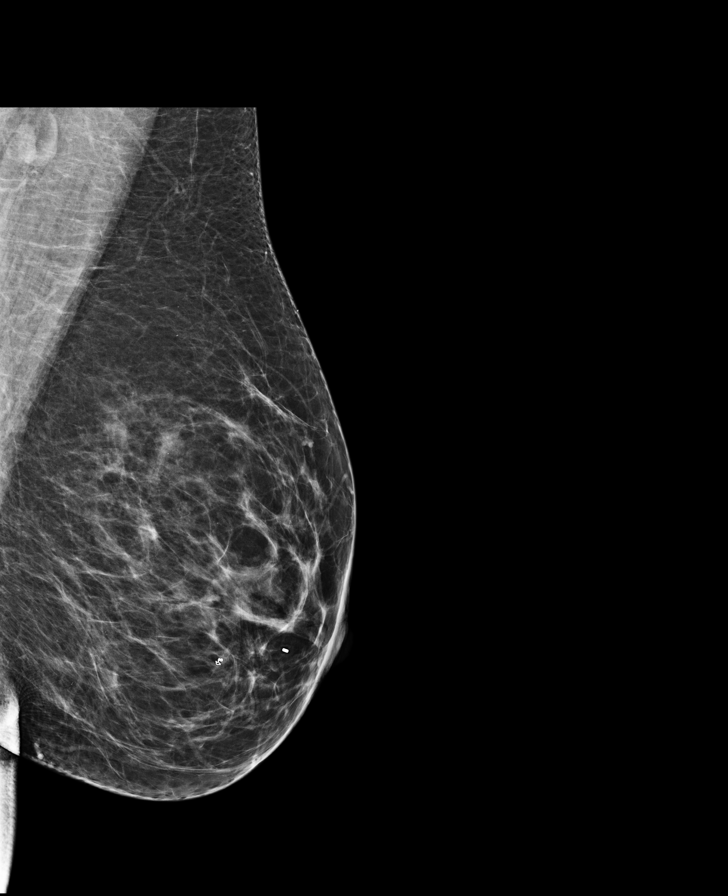

[L CC]
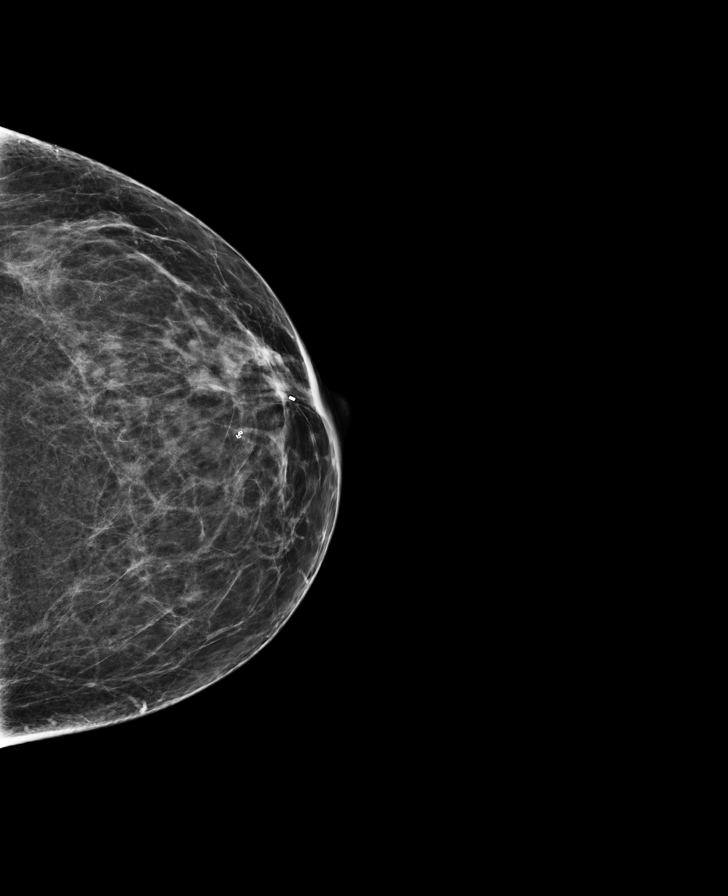

[R MLO]
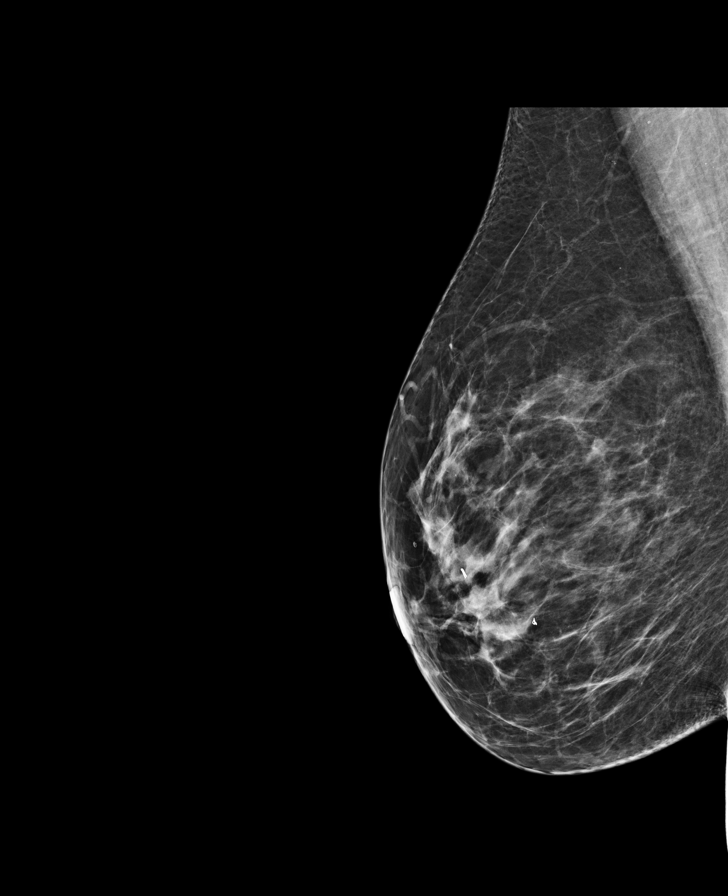

[R CC]
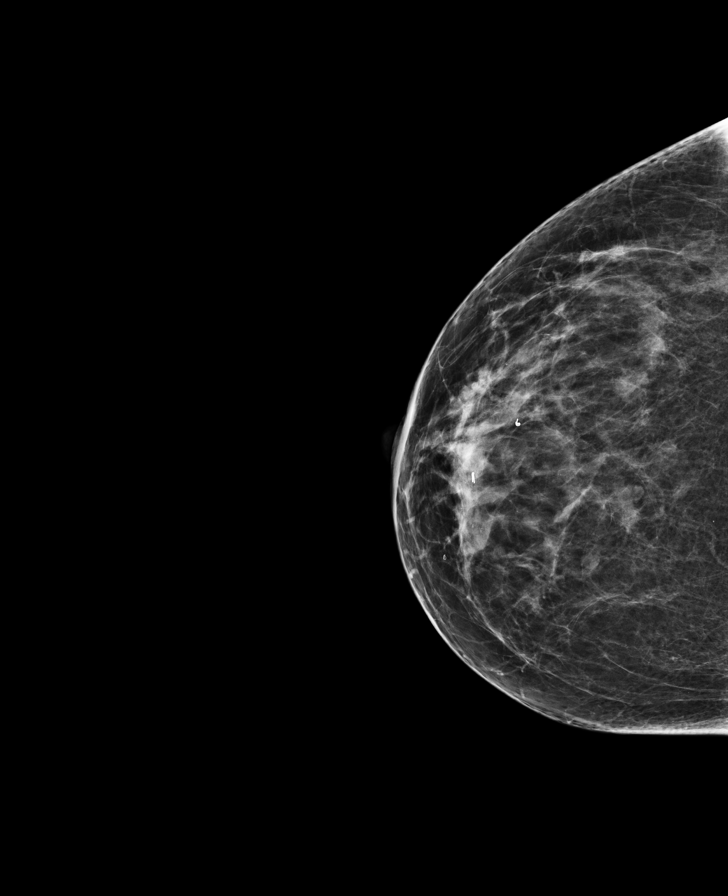

[R MLO tomo · tomo slice 37/73.0]
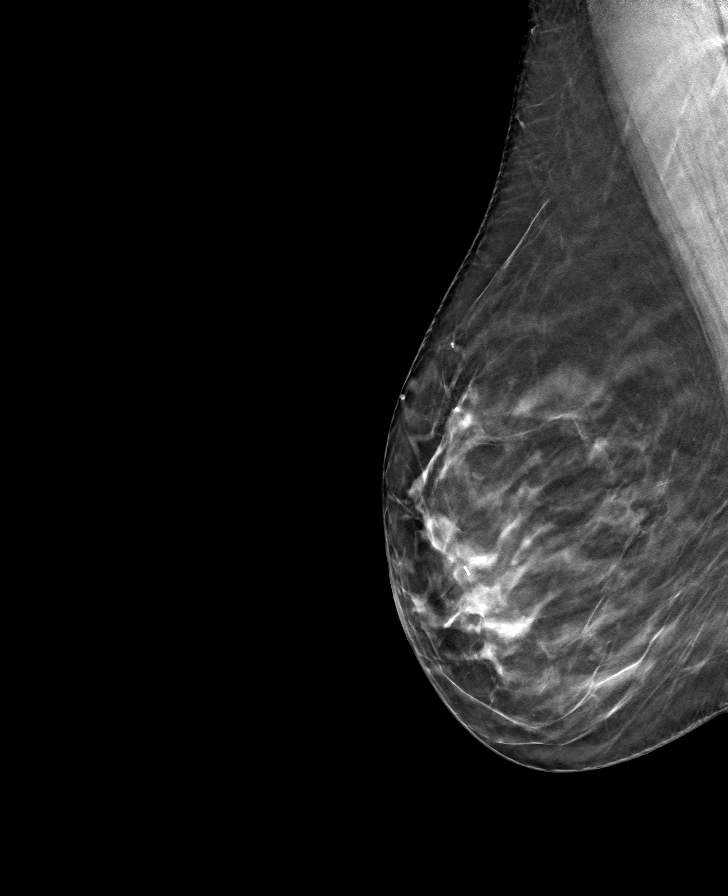

[L MLO tomo · tomo slice 38/75.0]
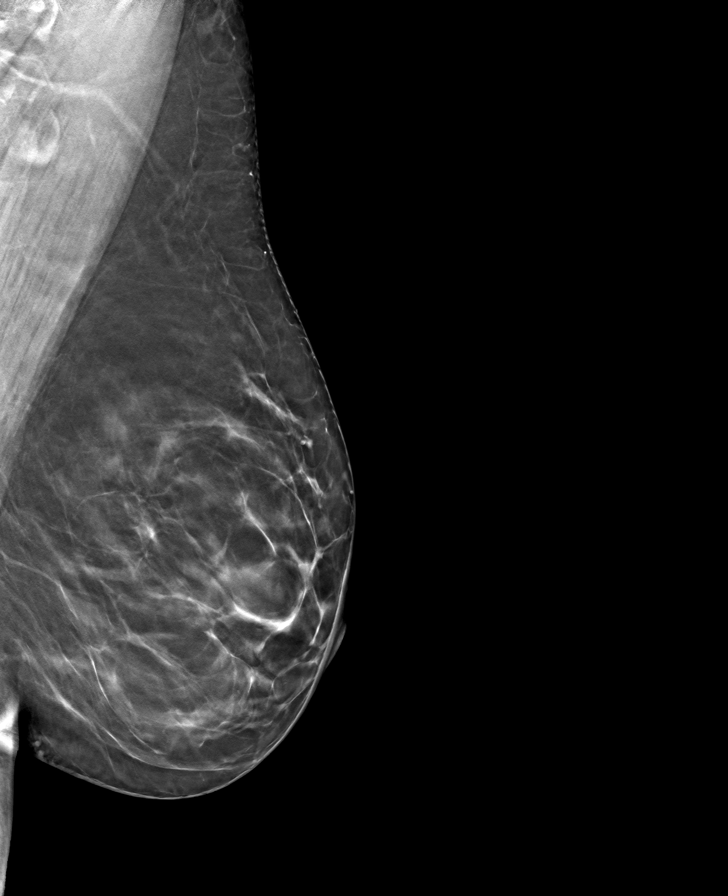

[L CC tomo · tomo slice 35/69.0]
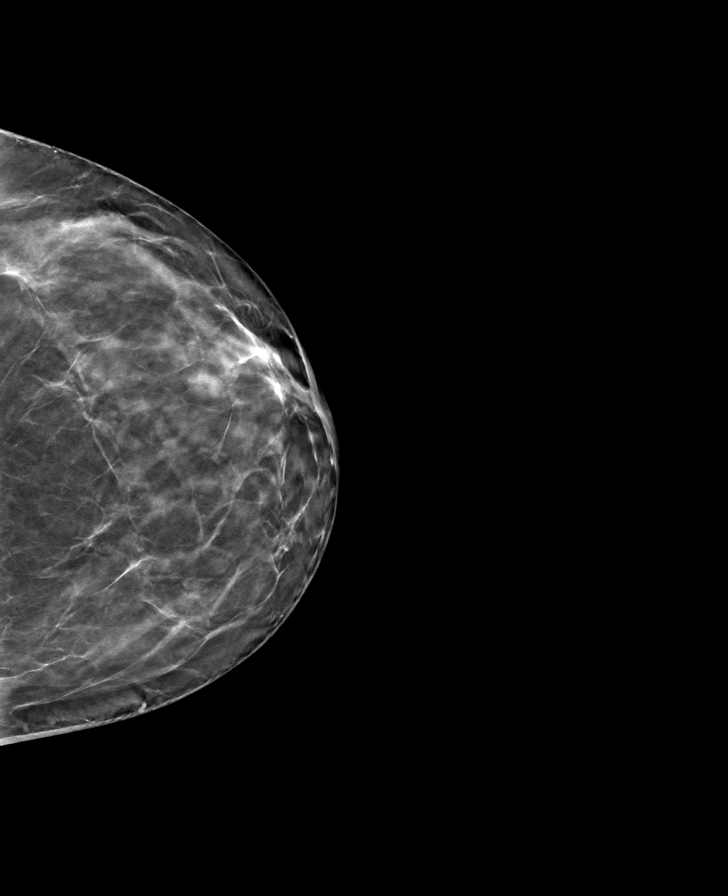

[R CC tomo · tomo slice 35/68.0]
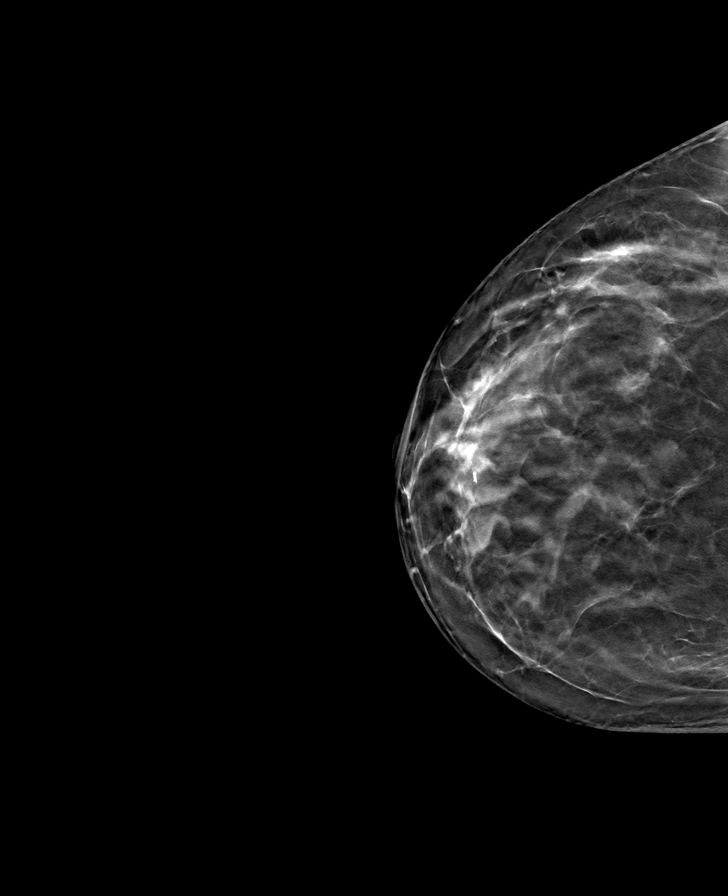

[8 of 24 positions shown; findings below may reference images not displayed]

IMPRESSION: There is no mammographic evidence of malignancy.
Screening mammogram recommended in 1 year.
BI-RADS Category 1: Negative

## 2021-09-29 IMAGING — CT CT ABD/PEL W CONT
2 of 4 series · 11 of 46 positions shown, 12 images · IV contrast (isovue)
Comparison: None.

Images Obtained from [facility]
HISTORY: Right lower quadrant pain for 3 days.
TECHNIQUE: Contrast enhanced CT of the abdomen and pelvis. Coronal and sagittal imaging were provided for interpretation. 100 mL of Isovue 300 was utilized.  Creatinine 0.7 mg/dL.
Dose reduction technique used: Automated exposure control and adjustment of the mA and/or kV according to patient size. CT Studies and Cardiac Nuclear Medicine Studies in last 12-months = 0

[Series 4: soft tissue · axial · 0.49mm/px · z∈[-1602,-1212]mm · 8 of 92 slices shown, 9 images]
[im 7/92  soft-tissue]
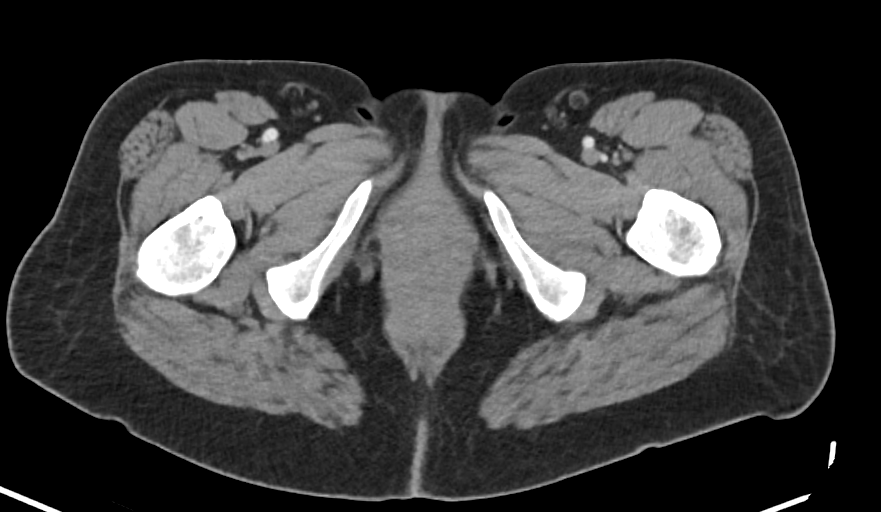
[im 7/92  bone]
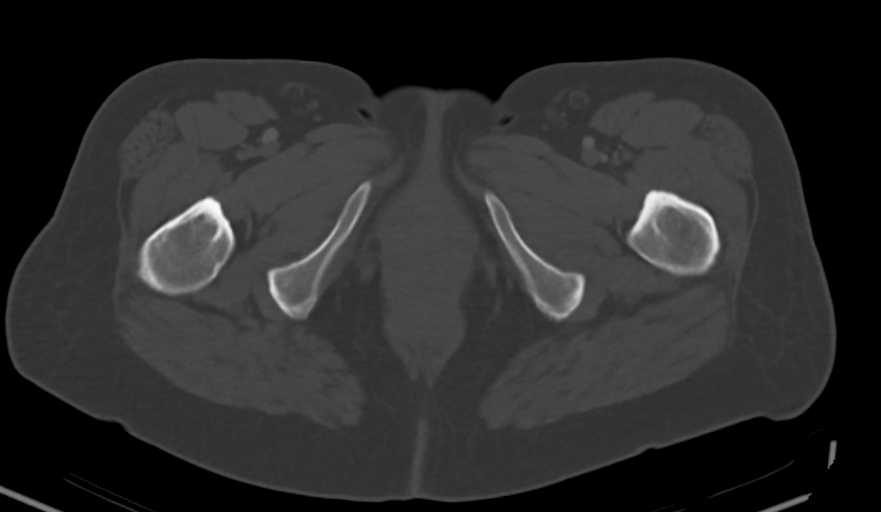
[im 17/92  soft-tissue]
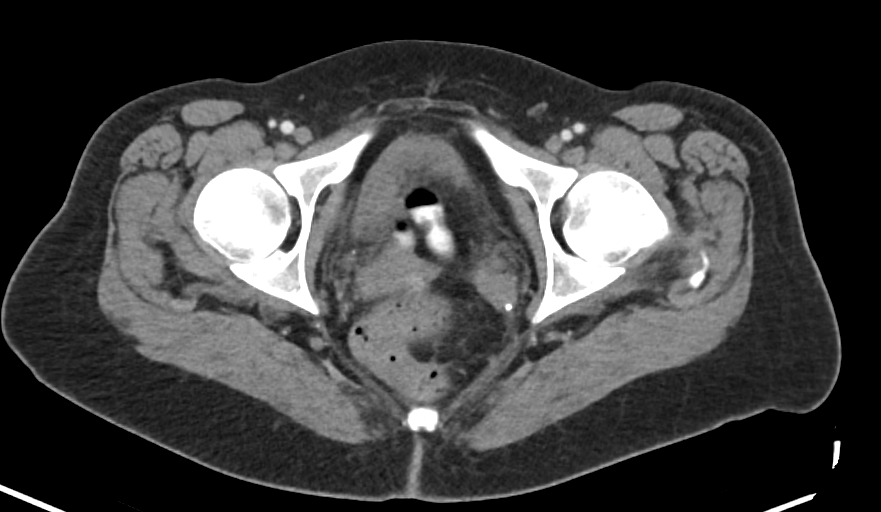
[im 31/92  soft-tissue]
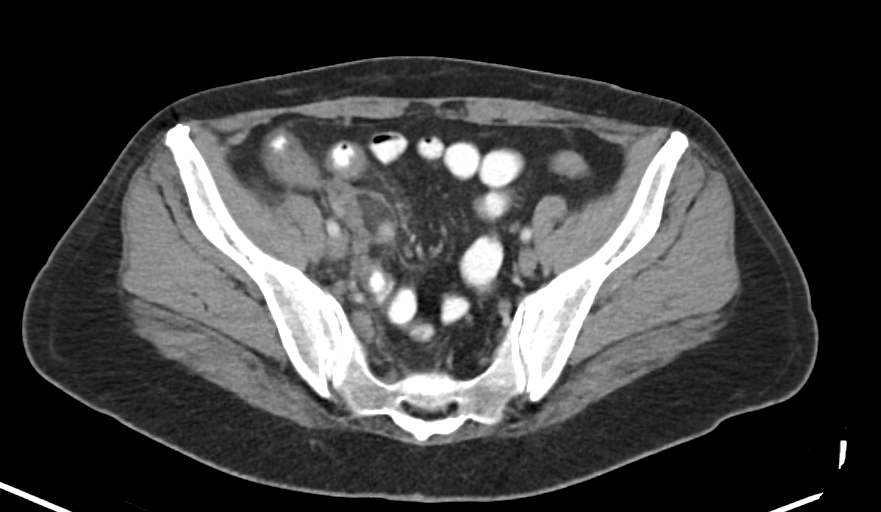
[im 41/92  soft-tissue]
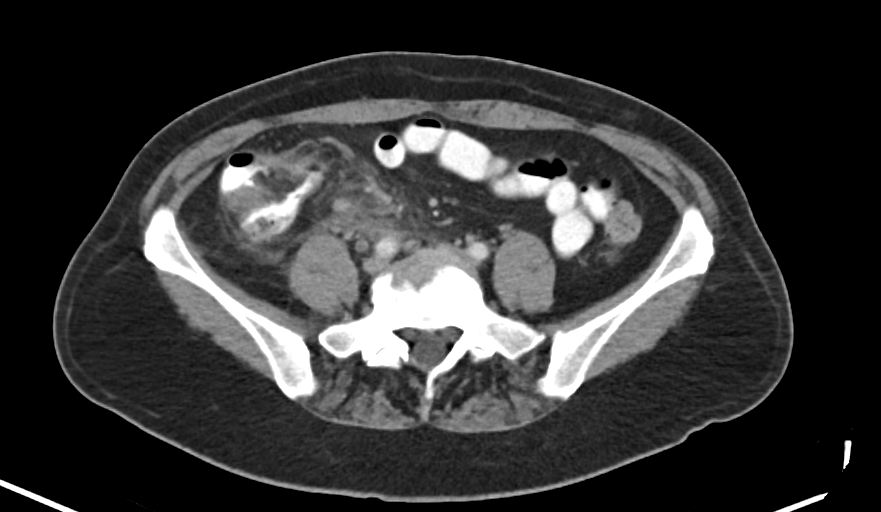
[im 51/92  soft-tissue]
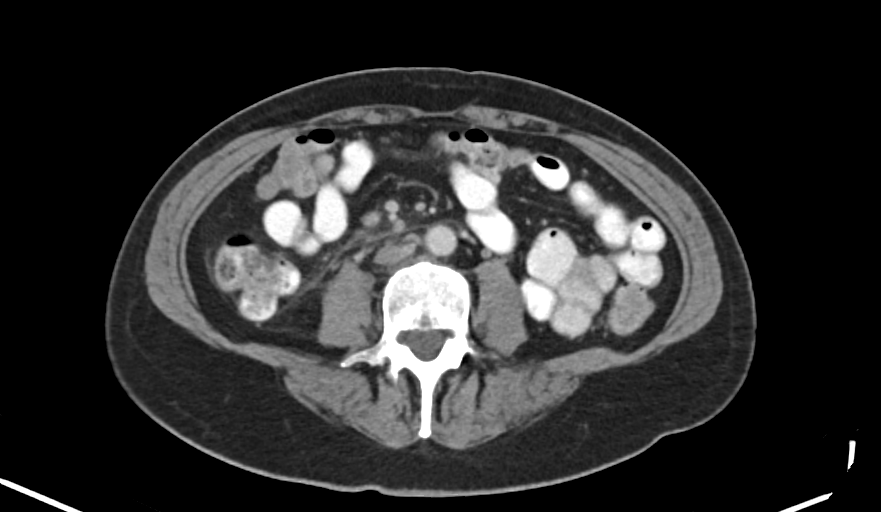
[im 61/92  soft-tissue]
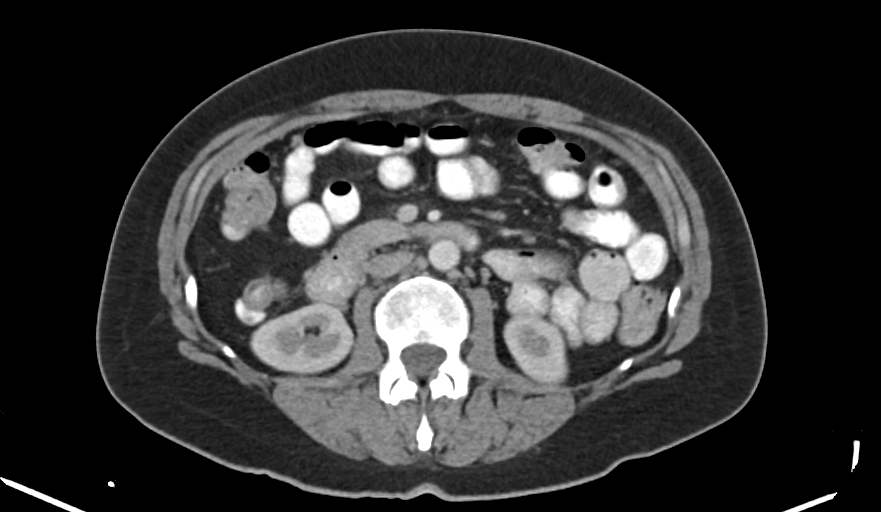
[im 75/92  soft-tissue]
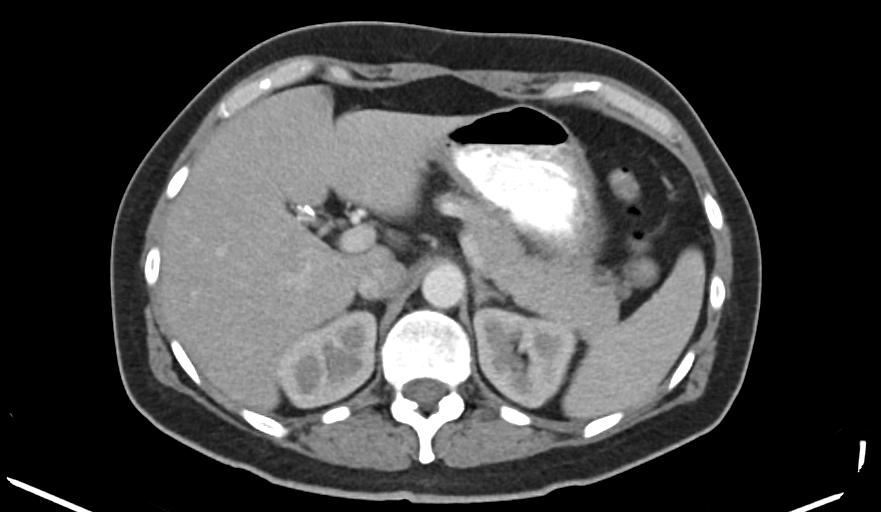
[im 85/92  soft-tissue]
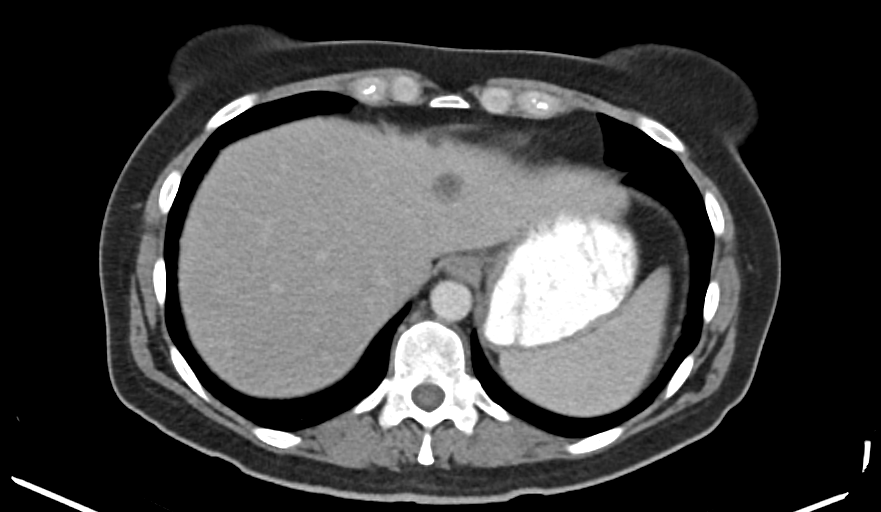

[Series 8: coronal · coronal · 0.85mm/px · 3 of 48 slices shown]
[im 16/48  soft-tissue]
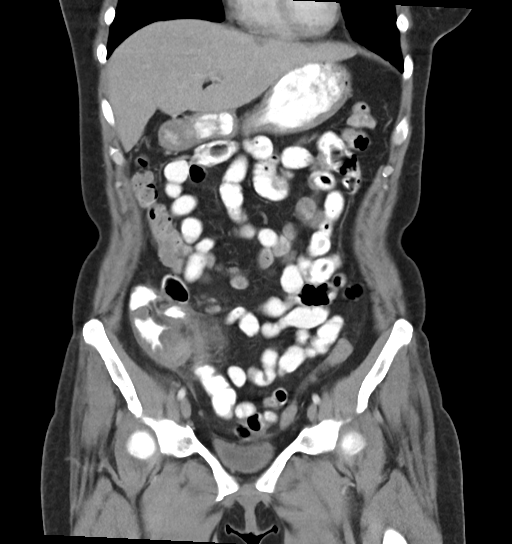
[im 21/48  soft-tissue]
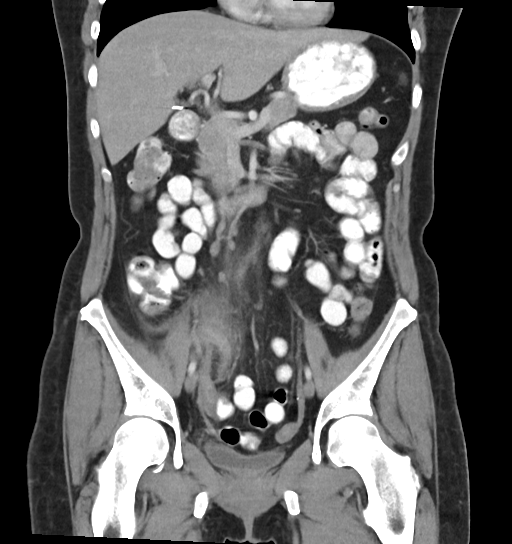
[im 27/48  soft-tissue]
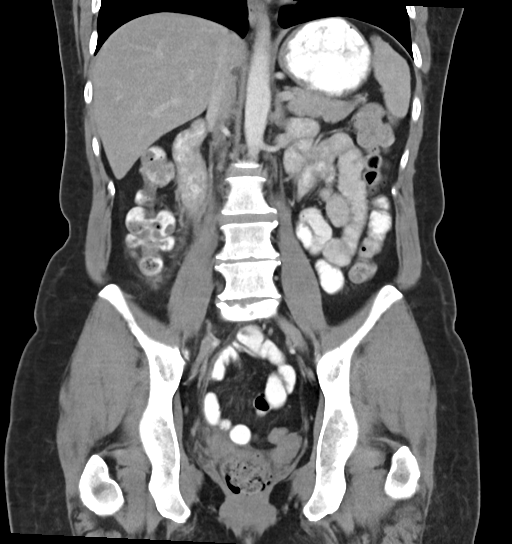

[11 of 46 positions shown; findings below may reference images not displayed]

FINDINGS: Visualized lung bases are clear. Heart size is normal.
Two small liver cysts in the left hepatic lobe. No suspicious liver mass. No biliary duct dilatation. Status post cholecystectomy. The spleen is normal size. No adrenal gland nodule. No
hydronephrosis. No suspicious renal mass. No nephrolithiasis. No pancreatic mass or inflammation.
Urinary bladder is unremarkable. No bowel obstruction. Acute ruptured appendicitis with marked fluid and inflammatory change about the appendix in the right lower quadrant. Suggestion of an
appendicolith measuring 8 mm in the right lower quadrant near the appendix. Tiny focus of air in the right lower quadrant near the appendix, possible free air related to ruptured appendicitis. No
suspicious lymphadenopathy. No hernia. No inguinal adenopathy. Abdominal aorta is unremarkable. A few scattered borderline prominent lymph nodes in the central abdomen, likely infectious/inflammatory
related to appendicitis. Mild thickening of the distal right ureter, likely to be related to inflammatory change the right lower quadrant. No hydroureter.
Mild-moderate lumbar spine degenerative changes.
IMPRESSION: 1. Acute ruptured appendicitis with marked fluid and inflammatory change about the appendix in the right lower quadrant. Tiny focus of air in the right lower quadrant near the appendix, possible free
air related to ruptured appendicitis.
2. Two low-density lesions in the left hepatic lobe measuring 16 and 8 mm, respectively, likely simple cysts. Other etiology such as hemangioma not excluded. Recommend nonemergent ultrasound liver to
confirm benignity.
Total radiation dose to patient is CTDIvol 13.43 mGy and DLP 411.90 mGy-cm.
Findings discussed with Polin Billiot, PA at [DATE] on 09/29/2021. Patient instructed to go to the hospital emergency room.

## 2022-06-16 IMAGING — CR CHEST 2 VWS PA LAT
2 series · 2 of 2 positions shown · non-contrast
Comparison: None

Images Obtained from [facility]
HISTORY: Chest pain, Anxiety disorder, Palpitations
TECHNIQUE: PA and lateral chest radiographs, two views.

[w chest pa]
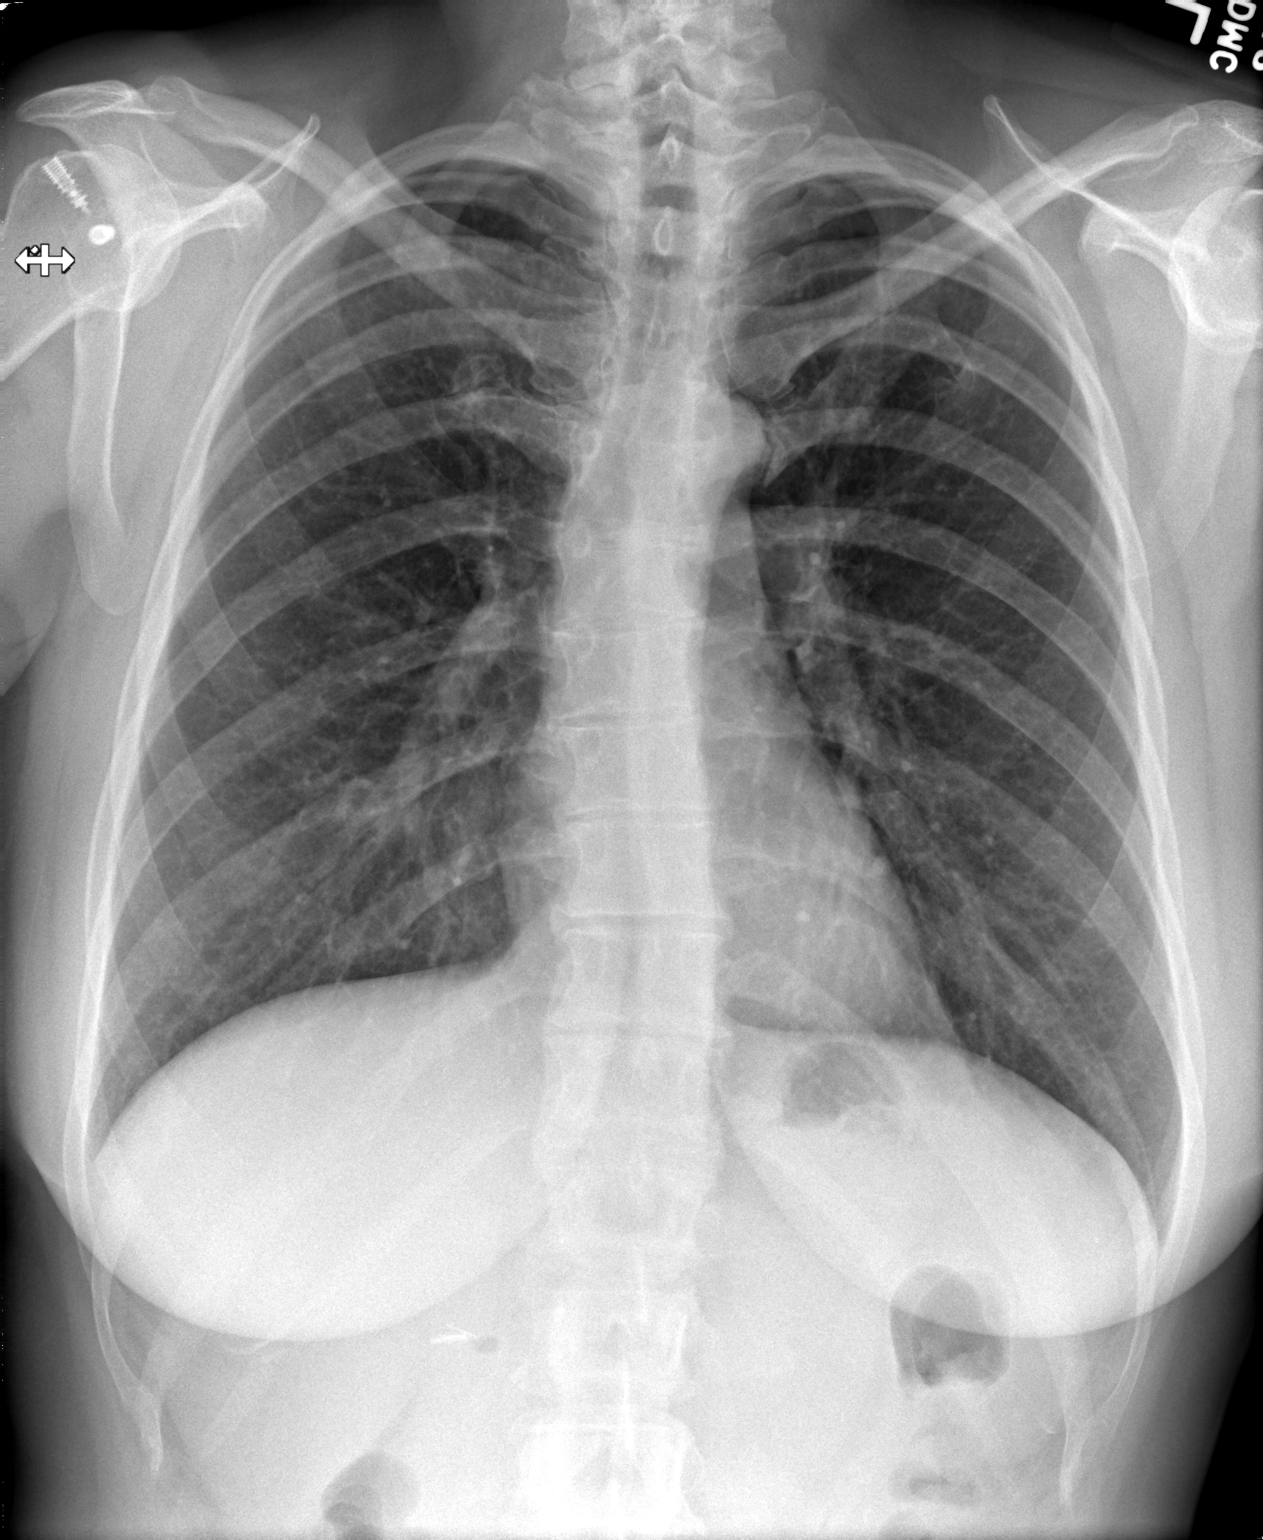

[w chest lat]
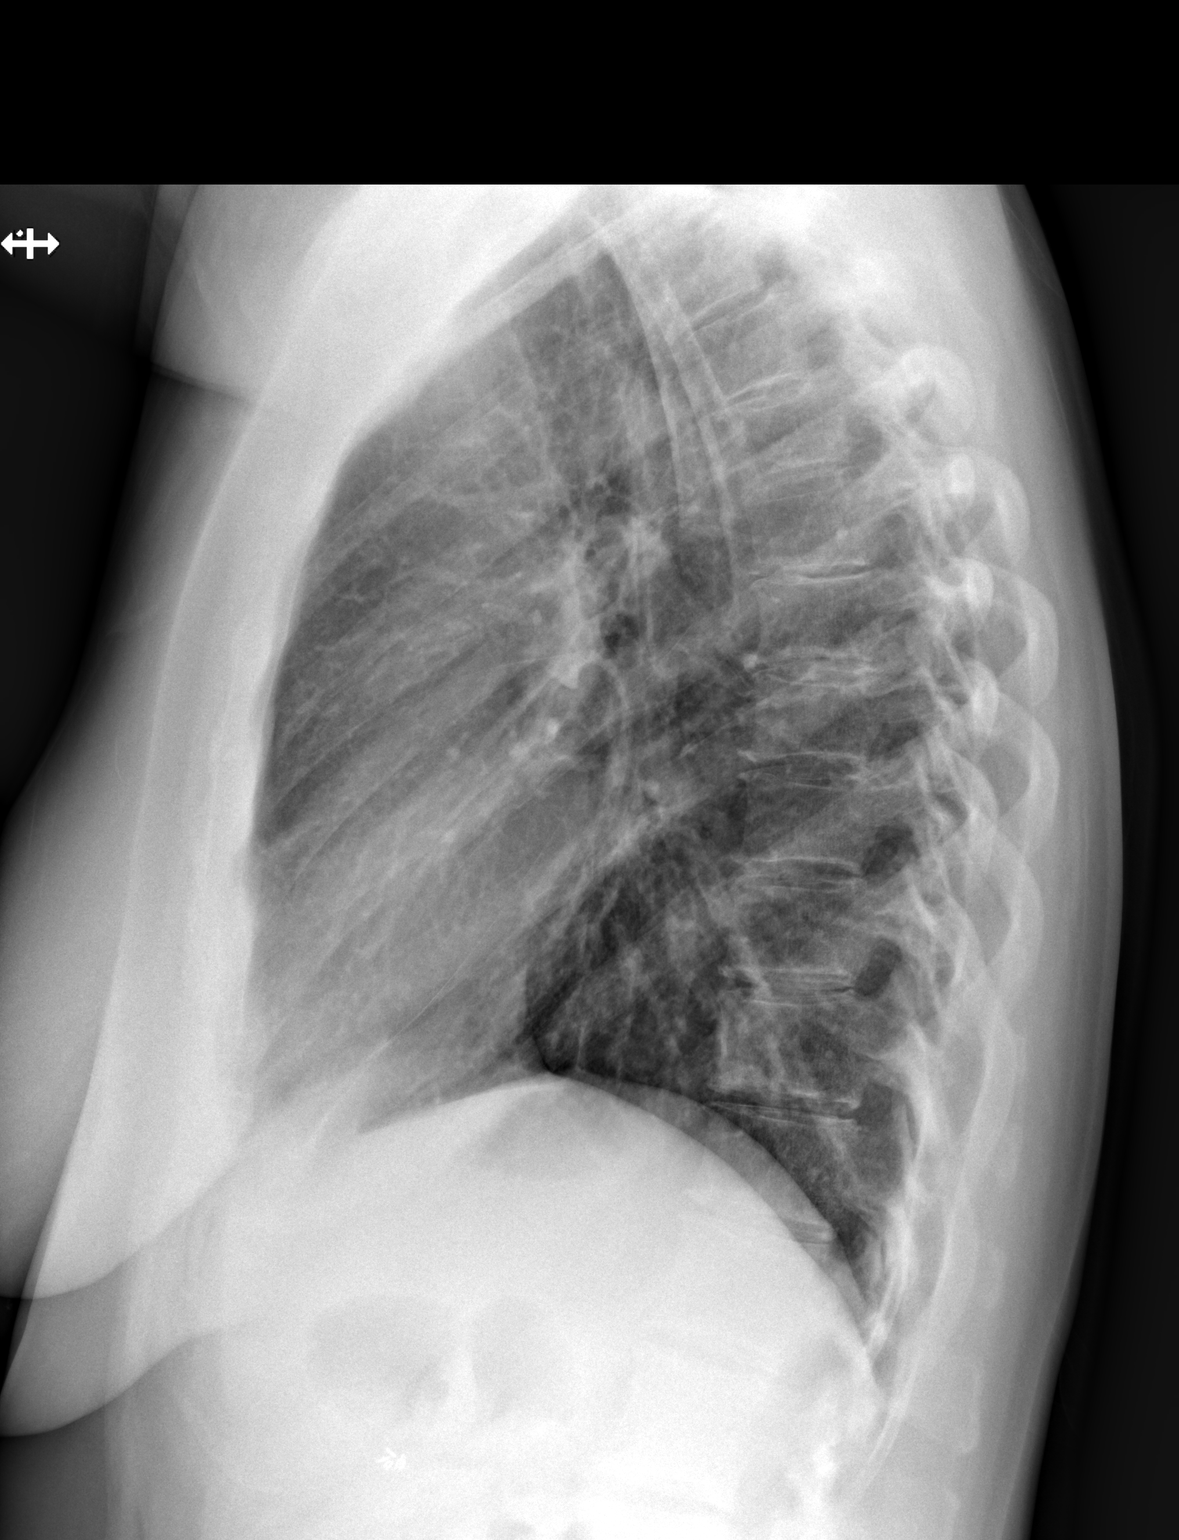

[2 of 2 positions shown; findings below may reference images not displayed]

FINDINGS: Heart size is normal. No pleural effusion or pneumothorax. No consolidation.
IMPRESSION: No acute chest findings.

## 2022-08-05 IMAGING — MG MAMMO SCRN BIL W/CAD/TOMO
8 series · 8 of 24 positions shown · non-contrast
Comparison: The present examination has been compared to prior imaging studies.

Images Obtained from [facility]
INDICATION: Screening.
TECHNIQUE: Bilateral 2-D digital screening mammogram was performed followed by 3-D tomosynthesis.  Current study was also evaluated with a computer aided detection (CAD) system.
MAMMOGRAM FINDINGS:
There are scattered areas of fibroglandular density.
No suspicious abnormality is seen in either breast.  There are no significant changes from the prior study.

[L CC]
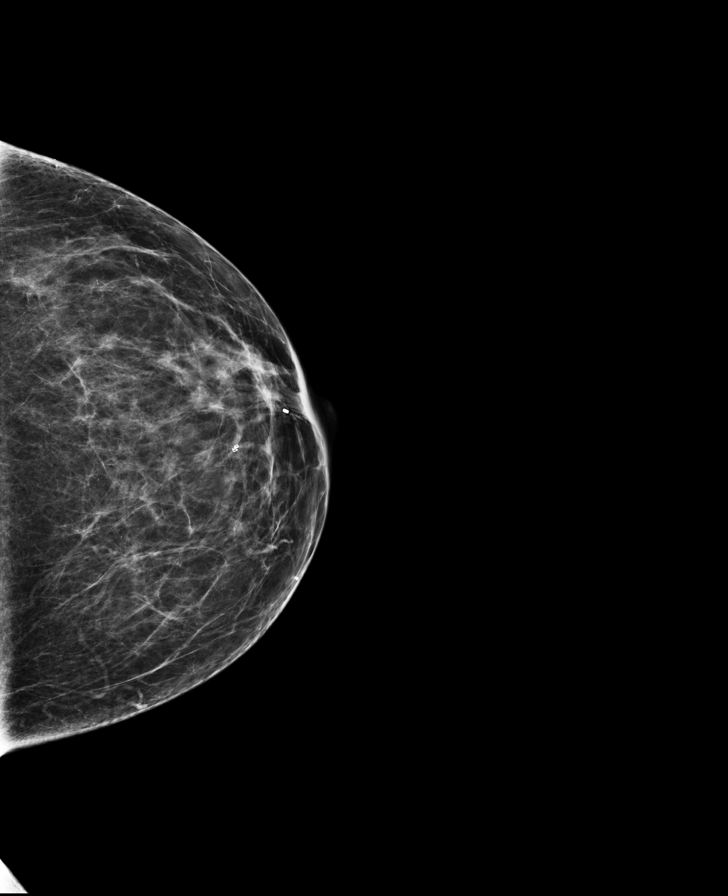

[R MLO]
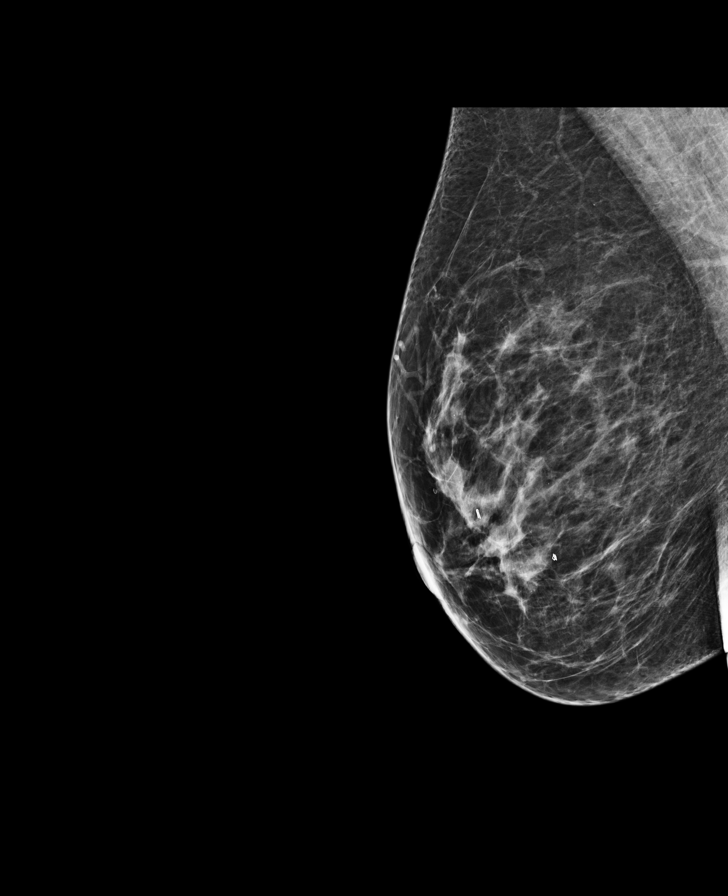

[L MLO]
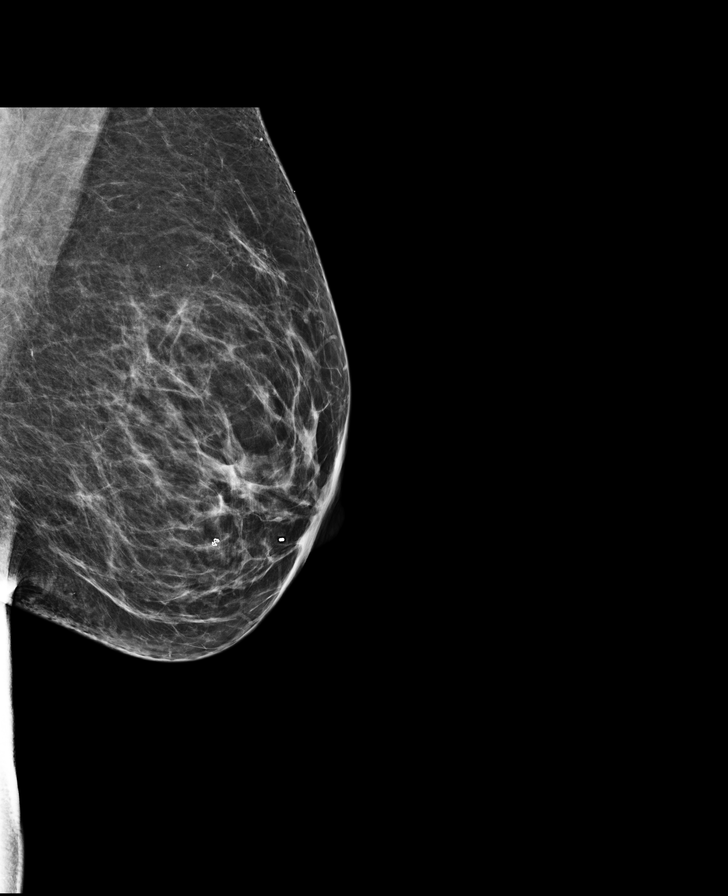

[R CC]
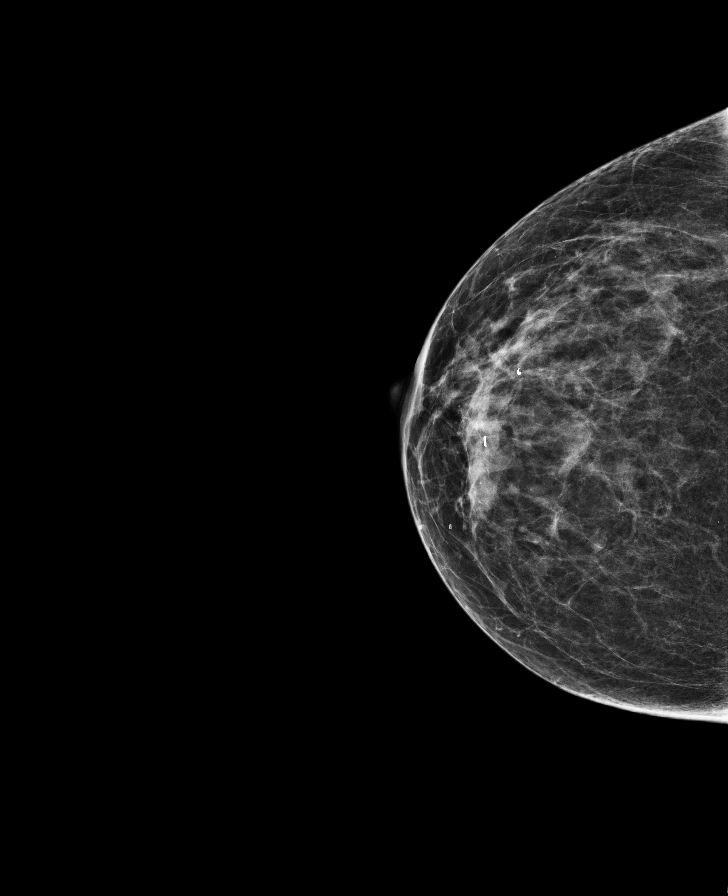

[R CC tomo · tomo slice 33/66.0]
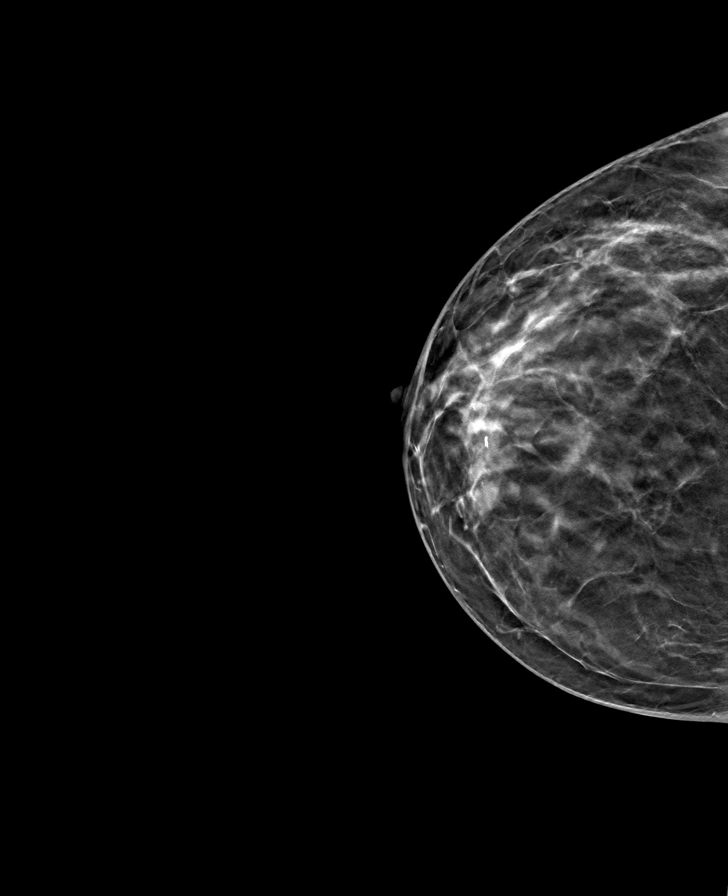

[R MLO tomo · tomo slice 37/73.0]
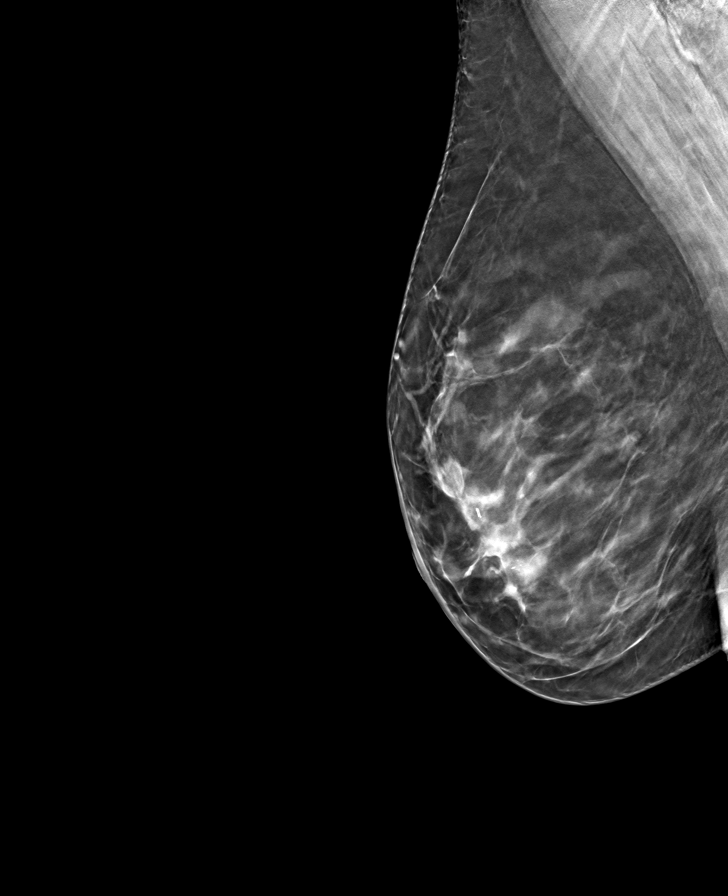

[L CC tomo · tomo slice 35/70.0]
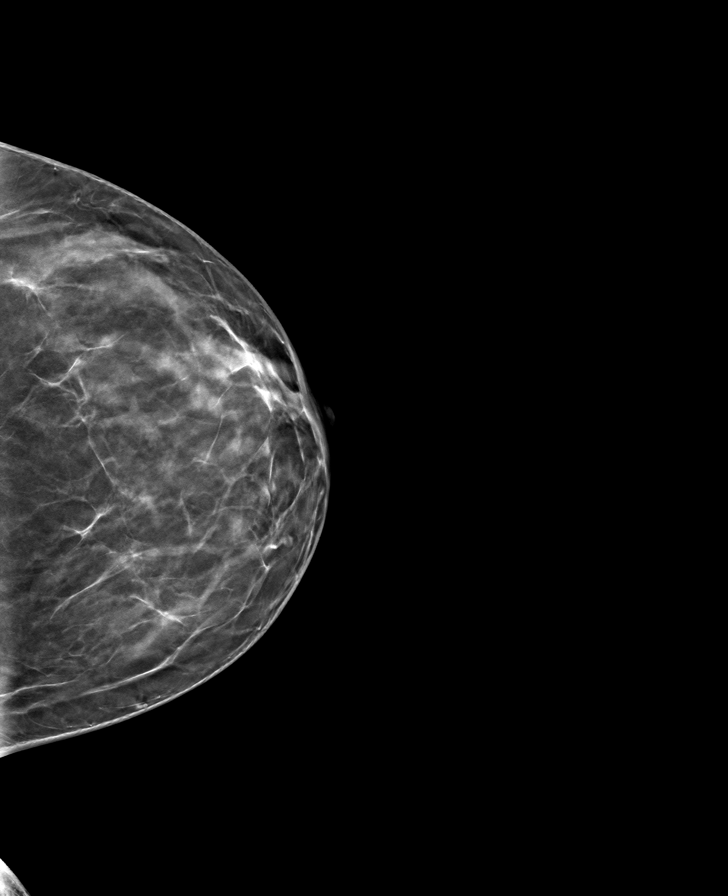

[L MLO tomo · tomo slice 38/75.0]
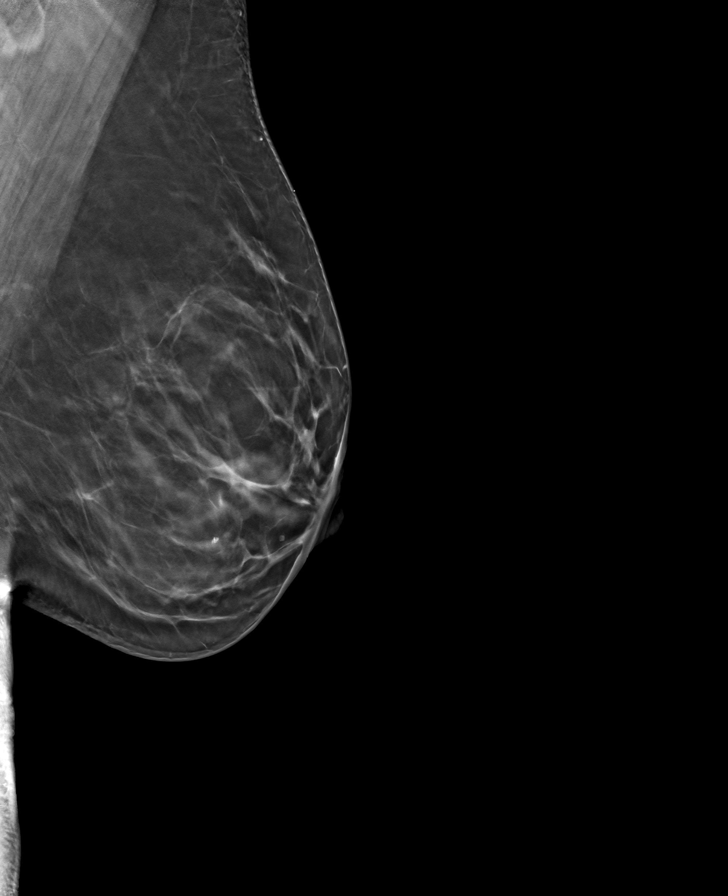

[8 of 24 positions shown; findings below may reference images not displayed]

IMPRESSION: There is no mammographic evidence of malignancy.
Screening mammogram recommended in 1 year.
BI-RADS Category 1: Negative

## 7363-04-04 DEATH — deceased
